# Patient Record
Sex: Female | Born: 1940 | Race: White | Hispanic: No | Smoking: Former smoker
Health system: Southern US, Community
[De-identification: ages and names within clinical notes are randomized; demographics above are authoritative.]

## PROBLEM LIST (undated history)

## (undated) ENCOUNTER — Emergency Department (HOSPITAL_COMMUNITY): Payer: Medicare Other

## (undated) DIAGNOSIS — C4490 Unspecified malignant neoplasm of skin, unspecified: Secondary | ICD-10-CM

## (undated) DIAGNOSIS — M810 Age-related osteoporosis without current pathological fracture: Secondary | ICD-10-CM

## (undated) DIAGNOSIS — C189 Malignant neoplasm of colon, unspecified: Secondary | ICD-10-CM

## (undated) DIAGNOSIS — K219 Gastro-esophageal reflux disease without esophagitis: Secondary | ICD-10-CM

## (undated) DIAGNOSIS — I10 Essential (primary) hypertension: Secondary | ICD-10-CM

## (undated) DIAGNOSIS — C50919 Malignant neoplasm of unspecified site of unspecified female breast: Secondary | ICD-10-CM

## (undated) DIAGNOSIS — F32A Depression, unspecified: Secondary | ICD-10-CM

## (undated) DIAGNOSIS — C772 Secondary and unspecified malignant neoplasm of intra-abdominal lymph nodes: Secondary | ICD-10-CM

## (undated) DIAGNOSIS — M1990 Unspecified osteoarthritis, unspecified site: Secondary | ICD-10-CM

## (undated) DIAGNOSIS — F329 Major depressive disorder, single episode, unspecified: Secondary | ICD-10-CM

## (undated) DIAGNOSIS — F419 Anxiety disorder, unspecified: Secondary | ICD-10-CM

## (undated) HISTORY — PX: BREAST SURGERY: SHX581

## (undated) HISTORY — PX: MULTIPLE TOOTH EXTRACTIONS: SHX2053

## (undated) HISTORY — PX: CHOLECYSTECTOMY: SHX55

## (undated) HISTORY — PX: MASTECTOMY: SHX3

## (undated) HISTORY — PX: SKIN CANCER EXCISION: SHX779

## (undated) HISTORY — PX: HAND SURGERY: SHX662

## (undated) HISTORY — PX: THYROID SURGERY: SHX805

## (undated) HISTORY — PX: COLON SURGERY: SHX602

---

## 1985-02-15 DIAGNOSIS — C772 Secondary and unspecified malignant neoplasm of intra-abdominal lymph nodes: Secondary | ICD-10-CM

## 1985-02-15 DIAGNOSIS — C189 Malignant neoplasm of colon, unspecified: Secondary | ICD-10-CM

## 1985-02-15 DIAGNOSIS — C4490 Unspecified malignant neoplasm of skin, unspecified: Secondary | ICD-10-CM

## 1985-02-15 HISTORY — DX: Unspecified malignant neoplasm of skin, unspecified: C44.90

## 1985-02-15 HISTORY — DX: Malignant neoplasm of colon, unspecified: C18.9

## 1985-02-15 HISTORY — DX: Malignant neoplasm of colon, unspecified: C77.2

## 1997-09-10 ENCOUNTER — Emergency Department (HOSPITAL_COMMUNITY): Admission: EM | Admit: 1997-09-10 | Discharge: 1997-09-10 | Payer: Self-pay | Admitting: Emergency Medicine

## 2000-02-02 ENCOUNTER — Other Ambulatory Visit: Admission: RE | Admit: 2000-02-02 | Discharge: 2000-02-02 | Payer: Self-pay | Admitting: Family Medicine

## 2000-02-07 ENCOUNTER — Emergency Department (HOSPITAL_COMMUNITY): Admission: EM | Admit: 2000-02-07 | Discharge: 2000-02-07 | Payer: Self-pay | Admitting: Emergency Medicine

## 2001-06-14 ENCOUNTER — Encounter: Payer: Self-pay | Admitting: Emergency Medicine

## 2001-06-14 ENCOUNTER — Emergency Department (HOSPITAL_COMMUNITY): Admission: EM | Admit: 2001-06-14 | Discharge: 2001-06-14 | Payer: Self-pay

## 2003-02-14 ENCOUNTER — Encounter: Admission: RE | Admit: 2003-02-14 | Discharge: 2003-02-14 | Payer: Self-pay | Admitting: Family Medicine

## 2003-02-14 ENCOUNTER — Encounter (INDEPENDENT_AMBULATORY_CARE_PROVIDER_SITE_OTHER): Payer: Self-pay | Admitting: Specialist

## 2003-02-16 DIAGNOSIS — C50919 Malignant neoplasm of unspecified site of unspecified female breast: Secondary | ICD-10-CM

## 2003-02-16 HISTORY — DX: Malignant neoplasm of unspecified site of unspecified female breast: C50.919

## 2003-03-05 ENCOUNTER — Encounter (HOSPITAL_COMMUNITY): Admission: RE | Admit: 2003-03-05 | Discharge: 2003-06-03 | Payer: Self-pay

## 2003-03-08 ENCOUNTER — Ambulatory Visit (HOSPITAL_COMMUNITY): Admission: RE | Admit: 2003-03-08 | Discharge: 2003-03-08 | Payer: Self-pay

## 2003-03-13 ENCOUNTER — Inpatient Hospital Stay (HOSPITAL_COMMUNITY): Admission: RE | Admit: 2003-03-13 | Discharge: 2003-03-15 | Payer: Self-pay

## 2003-03-13 ENCOUNTER — Encounter (INDEPENDENT_AMBULATORY_CARE_PROVIDER_SITE_OTHER): Payer: Self-pay | Admitting: *Deleted

## 2003-04-25 ENCOUNTER — Encounter: Admission: RE | Admit: 2003-04-25 | Discharge: 2003-04-25 | Payer: Self-pay | Admitting: Oncology

## 2003-04-26 ENCOUNTER — Ambulatory Visit (HOSPITAL_COMMUNITY): Admission: RE | Admit: 2003-04-26 | Discharge: 2003-04-26 | Payer: Self-pay | Admitting: Oncology

## 2003-04-29 ENCOUNTER — Ambulatory Visit: Admission: RE | Admit: 2003-04-29 | Discharge: 2003-06-04 | Payer: Self-pay | Admitting: *Deleted

## 2004-01-23 ENCOUNTER — Ambulatory Visit: Payer: Self-pay | Admitting: Oncology

## 2004-02-11 ENCOUNTER — Emergency Department (HOSPITAL_COMMUNITY): Admission: EM | Admit: 2004-02-11 | Discharge: 2004-02-12 | Payer: Self-pay | Admitting: Emergency Medicine

## 2004-02-14 ENCOUNTER — Encounter: Admission: RE | Admit: 2004-02-14 | Discharge: 2004-02-14 | Payer: Self-pay | Admitting: Oncology

## 2004-03-09 ENCOUNTER — Ambulatory Visit: Payer: Self-pay | Admitting: Oncology

## 2004-06-03 ENCOUNTER — Ambulatory Visit: Payer: Self-pay | Admitting: Oncology

## 2004-10-06 ENCOUNTER — Ambulatory Visit: Payer: Self-pay | Admitting: Oncology

## 2005-02-05 ENCOUNTER — Ambulatory Visit: Payer: Self-pay | Admitting: Oncology

## 2005-02-16 ENCOUNTER — Encounter: Admission: RE | Admit: 2005-02-16 | Discharge: 2005-02-16 | Payer: Self-pay | Admitting: Oncology

## 2005-06-03 ENCOUNTER — Ambulatory Visit: Payer: Self-pay | Admitting: Oncology

## 2005-06-04 LAB — CBC WITH DIFFERENTIAL/PLATELET
BASO%: 0.4 % (ref 0.0–2.0)
HCT: 39.5 % (ref 34.8–46.6)
LYMPH%: 26.3 % (ref 14.0–48.0)
MCHC: 34.4 g/dL (ref 32.0–36.0)
MCV: 93.6 fL (ref 81.0–101.0)
MONO#: 0.5 10*3/uL (ref 0.1–0.9)
MONO%: 6.6 % (ref 0.0–13.0)
NEUT%: 65.8 % (ref 39.6–76.8)
Platelets: 289 10*3/uL (ref 145–400)
RBC: 4.23 10*6/uL (ref 3.70–5.32)
WBC: 8.1 10*3/uL (ref 3.9–10.0)

## 2005-06-04 LAB — COMPREHENSIVE METABOLIC PANEL
ALT: 13 U/L (ref 0–40)
Alkaline Phosphatase: 62 U/L (ref 39–117)
CO2: 30 mEq/L (ref 19–32)
Creatinine, Ser: 0.7 mg/dL (ref 0.4–1.2)
Glucose, Bld: 84 mg/dL (ref 70–99)
Sodium: 140 mEq/L (ref 135–145)
Total Bilirubin: 0.4 mg/dL (ref 0.3–1.2)

## 2005-06-04 LAB — CANCER ANTIGEN 27.29: CA 27.29: 32 U/mL (ref 0–39)

## 2005-06-04 LAB — LACTATE DEHYDROGENASE: LDH: 159 U/L (ref 94–250)

## 2005-06-18 ENCOUNTER — Encounter: Admission: RE | Admit: 2005-06-18 | Discharge: 2005-06-18 | Payer: Self-pay | Admitting: Oncology

## 2005-11-03 ENCOUNTER — Emergency Department (HOSPITAL_COMMUNITY): Admission: EM | Admit: 2005-11-03 | Discharge: 2005-11-03 | Payer: Self-pay | Admitting: Emergency Medicine

## 2005-11-08 ENCOUNTER — Ambulatory Visit: Payer: Self-pay | Admitting: Oncology

## 2005-11-10 ENCOUNTER — Ambulatory Visit (HOSPITAL_BASED_OUTPATIENT_CLINIC_OR_DEPARTMENT_OTHER): Admission: RE | Admit: 2005-11-10 | Discharge: 2005-11-10 | Payer: Self-pay | Admitting: Orthopedic Surgery

## 2006-02-02 ENCOUNTER — Ambulatory Visit: Payer: Self-pay | Admitting: Oncology

## 2006-02-09 LAB — CBC WITH DIFFERENTIAL/PLATELET
BASO%: 0.5 % (ref 0.0–2.0)
Eosinophils Absolute: 0.1 10*3/uL (ref 0.0–0.5)
HCT: 40.9 % (ref 34.8–46.6)
LYMPH%: 28.5 % (ref 14.0–48.0)
MCHC: 34.4 g/dL (ref 32.0–36.0)
MCV: 93.3 fL (ref 81.0–101.0)
MONO#: 0.8 10*3/uL (ref 0.1–0.9)
MONO%: 6.8 % (ref 0.0–13.0)
NEUT%: 63.2 % (ref 39.6–76.8)
Platelets: 348 10*3/uL (ref 145–400)
RBC: 4.38 10*6/uL (ref 3.70–5.32)

## 2006-02-09 LAB — COMPREHENSIVE METABOLIC PANEL
Alkaline Phosphatase: 91 U/L (ref 39–117)
CO2: 29 mEq/L (ref 19–32)
Creatinine, Ser: 0.83 mg/dL (ref 0.40–1.20)
Glucose, Bld: 111 mg/dL — ABNORMAL HIGH (ref 70–99)
Sodium: 140 mEq/L (ref 135–145)
Total Bilirubin: 0.3 mg/dL (ref 0.3–1.2)
Total Protein: 7.3 g/dL (ref 6.0–8.3)

## 2006-02-09 LAB — CANCER ANTIGEN 27.29: CA 27.29: 38 U/mL (ref 0–39)

## 2006-02-09 LAB — LACTATE DEHYDROGENASE: LDH: 159 U/L (ref 94–250)

## 2006-02-24 ENCOUNTER — Encounter: Admission: RE | Admit: 2006-02-24 | Discharge: 2006-02-24 | Payer: Self-pay | Admitting: Oncology

## 2006-03-29 ENCOUNTER — Encounter: Admission: RE | Admit: 2006-03-29 | Discharge: 2006-03-29 | Payer: Self-pay | Admitting: Rheumatology

## 2006-09-05 ENCOUNTER — Ambulatory Visit: Payer: Self-pay | Admitting: Oncology

## 2006-09-28 ENCOUNTER — Encounter: Admission: RE | Admit: 2006-09-28 | Discharge: 2006-09-28 | Payer: Self-pay | Admitting: Rheumatology

## 2006-10-12 LAB — COMPREHENSIVE METABOLIC PANEL
Albumin: 3.9 g/dL (ref 3.5–5.2)
BUN: 19 mg/dL (ref 6–23)
CO2: 30 mEq/L (ref 19–32)
Calcium: 9.4 mg/dL (ref 8.4–10.5)
Chloride: 97 mEq/L (ref 96–112)
Glucose, Bld: 110 mg/dL — ABNORMAL HIGH (ref 70–99)
Potassium: 3.1 mEq/L — ABNORMAL LOW (ref 3.5–5.3)

## 2006-10-12 LAB — CBC WITH DIFFERENTIAL/PLATELET
BASO%: 0.4 % (ref 0.0–2.0)
Basophils Absolute: 0 10*3/uL (ref 0.0–0.1)
EOS%: 1.1 % (ref 0.0–7.0)
Eosinophils Absolute: 0.1 10*3/uL (ref 0.0–0.5)
HCT: 36.6 % (ref 34.8–46.6)
HGB: 13.2 g/dL (ref 11.6–15.9)
LYMPH%: 31.6 % (ref 14.0–48.0)
MCH: 33.2 pg (ref 26.0–34.0)
MCHC: 35.9 g/dL (ref 32.0–36.0)
MCV: 92.4 fL (ref 81.0–101.0)
MONO#: 0.5 10*3/uL (ref 0.1–0.9)
MONO%: 6.6 % (ref 0.0–13.0)
NEUT#: 4.8 10*3/uL (ref 1.5–6.5)
NEUT%: 60.3 % (ref 39.6–76.8)
Platelets: 269 10*3/uL (ref 145–400)
RBC: 3.96 10*6/uL (ref 3.70–5.32)
RDW: 12.5 % (ref 11.3–14.5)
WBC: 8 10*3/uL (ref 3.9–10.0)
lymph#: 2.5 10*3/uL (ref 0.9–3.3)

## 2006-10-24 ENCOUNTER — Encounter: Admission: RE | Admit: 2006-10-24 | Discharge: 2006-10-24 | Payer: Self-pay | Admitting: Rheumatology

## 2006-11-15 ENCOUNTER — Ambulatory Visit: Payer: Self-pay | Admitting: Oncology

## 2006-11-18 LAB — BASIC METABOLIC PANEL
BUN: 20 mg/dL (ref 6–23)
CO2: 27 mEq/L (ref 19–32)
Chloride: 101 mEq/L (ref 96–112)
Glucose, Bld: 98 mg/dL (ref 70–99)
Potassium: 3.5 mEq/L (ref 3.5–5.3)

## 2007-02-27 ENCOUNTER — Encounter: Admission: RE | Admit: 2007-02-27 | Discharge: 2007-02-27 | Payer: Self-pay | Admitting: Oncology

## 2007-03-07 ENCOUNTER — Encounter: Admission: RE | Admit: 2007-03-07 | Discharge: 2007-03-07 | Payer: Self-pay | Admitting: Oncology

## 2007-03-21 ENCOUNTER — Emergency Department (HOSPITAL_COMMUNITY): Admission: EM | Admit: 2007-03-21 | Discharge: 2007-03-21 | Payer: Self-pay | Admitting: Emergency Medicine

## 2007-03-22 ENCOUNTER — Ambulatory Visit (HOSPITAL_BASED_OUTPATIENT_CLINIC_OR_DEPARTMENT_OTHER): Admission: RE | Admit: 2007-03-22 | Discharge: 2007-03-22 | Payer: Self-pay | Admitting: *Deleted

## 2007-05-09 ENCOUNTER — Ambulatory Visit: Payer: Self-pay | Admitting: Oncology

## 2007-07-04 ENCOUNTER — Ambulatory Visit: Payer: Self-pay | Admitting: Oncology

## 2007-07-20 ENCOUNTER — Ambulatory Visit (HOSPITAL_COMMUNITY): Admission: RE | Admit: 2007-07-20 | Discharge: 2007-07-20 | Payer: Self-pay | Admitting: Oncology

## 2007-12-11 ENCOUNTER — Encounter: Admission: RE | Admit: 2007-12-11 | Discharge: 2007-12-11 | Payer: Self-pay | Admitting: Oncology

## 2007-12-26 ENCOUNTER — Ambulatory Visit: Payer: Self-pay | Admitting: Oncology

## 2007-12-28 LAB — CBC WITH DIFFERENTIAL/PLATELET
BASO%: 0.1 % (ref 0.0–2.0)
EOS%: 1.2 % (ref 0.0–7.0)
HCT: 41.2 % (ref 34.8–46.6)
MCHC: 34.6 g/dL (ref 32.0–36.0)
MONO#: 0.5 10*3/uL (ref 0.1–0.9)
RBC: 4.56 10*6/uL (ref 3.70–5.32)
RDW: 12.6 % (ref 11.3–14.5)
WBC: 9.2 10*3/uL (ref 3.9–10.0)
lymph#: 2.7 10*3/uL (ref 0.9–3.3)

## 2007-12-29 LAB — COMPREHENSIVE METABOLIC PANEL
ALT: 16 U/L (ref 0–35)
AST: 23 U/L (ref 0–37)
CO2: 27 mEq/L (ref 19–32)
Calcium: 9.4 mg/dL (ref 8.4–10.5)
Chloride: 99 mEq/L (ref 96–112)
Potassium: 3.3 mEq/L — ABNORMAL LOW (ref 3.5–5.3)
Sodium: 139 mEq/L (ref 135–145)
Total Protein: 7.2 g/dL (ref 6.0–8.3)

## 2007-12-29 LAB — LACTATE DEHYDROGENASE: LDH: 149 U/L (ref 94–250)

## 2008-03-01 ENCOUNTER — Encounter: Admission: RE | Admit: 2008-03-01 | Discharge: 2008-03-01 | Payer: Self-pay | Admitting: Oncology

## 2008-06-28 ENCOUNTER — Ambulatory Visit: Payer: Self-pay | Admitting: Oncology

## 2008-07-09 LAB — CBC WITH DIFFERENTIAL/PLATELET
Basophils Absolute: 0.1 10*3/uL (ref 0.0–0.1)
Eosinophils Absolute: 0.1 10*3/uL (ref 0.0–0.5)
HGB: 14.5 g/dL (ref 11.6–15.9)
LYMPH%: 24.8 % (ref 14.0–49.7)
MCV: 91.7 fL (ref 79.5–101.0)
MONO%: 6 % (ref 0.0–14.0)
NEUT#: 8.5 10*3/uL — ABNORMAL HIGH (ref 1.5–6.5)
Platelets: 269 10*3/uL (ref 145–400)
RDW: 12.6 % (ref 11.2–14.5)

## 2008-07-10 LAB — COMPREHENSIVE METABOLIC PANEL
Albumin: 4 g/dL (ref 3.5–5.2)
Alkaline Phosphatase: 68 U/L (ref 39–117)
BUN: 17 mg/dL (ref 6–23)
CO2: 29 mEq/L (ref 19–32)
Calcium: 9 mg/dL (ref 8.4–10.5)
Glucose, Bld: 103 mg/dL — ABNORMAL HIGH (ref 70–99)
Potassium: 3.3 mEq/L — ABNORMAL LOW (ref 3.5–5.3)

## 2008-07-10 LAB — VITAMIN D 25 HYDROXY (VIT D DEFICIENCY, FRACTURES): Vit D, 25-Hydroxy: 48 ng/mL (ref 30–89)

## 2008-07-10 LAB — LACTATE DEHYDROGENASE: LDH: 168 U/L (ref 94–250)

## 2008-07-11 ENCOUNTER — Ambulatory Visit (HOSPITAL_COMMUNITY): Admission: RE | Admit: 2008-07-11 | Discharge: 2008-07-11 | Payer: Self-pay | Admitting: Oncology

## 2008-07-23 ENCOUNTER — Encounter (INDEPENDENT_AMBULATORY_CARE_PROVIDER_SITE_OTHER): Payer: Self-pay | Admitting: Interventional Radiology

## 2008-07-23 ENCOUNTER — Other Ambulatory Visit: Admission: RE | Admit: 2008-07-23 | Discharge: 2008-07-23 | Payer: Self-pay | Admitting: Interventional Radiology

## 2008-07-23 ENCOUNTER — Encounter: Admission: RE | Admit: 2008-07-23 | Discharge: 2008-07-23 | Payer: Self-pay | Admitting: Oncology

## 2008-08-19 ENCOUNTER — Inpatient Hospital Stay (HOSPITAL_COMMUNITY): Admission: EM | Admit: 2008-08-19 | Discharge: 2008-08-21 | Payer: Self-pay | Admitting: Emergency Medicine

## 2009-01-03 ENCOUNTER — Ambulatory Visit: Payer: Self-pay | Admitting: Oncology

## 2009-01-07 LAB — CBC WITH DIFFERENTIAL/PLATELET
BASO%: 0.5 % (ref 0.0–2.0)
LYMPH%: 33.9 % (ref 14.0–49.7)
MCHC: 34.4 g/dL (ref 31.5–36.0)
MCV: 91.4 fL (ref 79.5–101.0)
MONO%: 4.6 % (ref 0.0–14.0)
Platelets: 240 10*3/uL (ref 145–400)
RBC: 4.48 10*6/uL (ref 3.70–5.45)
WBC: 9.9 10*3/uL (ref 3.9–10.3)

## 2009-01-08 LAB — COMPREHENSIVE METABOLIC PANEL
ALT: 10 U/L (ref 0–35)
Alkaline Phosphatase: 55 U/L (ref 39–117)
Sodium: 138 mEq/L (ref 135–145)
Total Bilirubin: 0.3 mg/dL (ref 0.3–1.2)
Total Protein: 7.2 g/dL (ref 6.0–8.3)

## 2009-01-28 ENCOUNTER — Encounter (HOSPITAL_COMMUNITY): Admission: RE | Admit: 2009-01-28 | Discharge: 2009-02-14 | Payer: Self-pay | Admitting: Oncology

## 2009-02-05 ENCOUNTER — Encounter: Admission: RE | Admit: 2009-02-05 | Discharge: 2009-02-05 | Payer: Self-pay | Admitting: Oncology

## 2009-02-05 ENCOUNTER — Other Ambulatory Visit: Admission: RE | Admit: 2009-02-05 | Discharge: 2009-02-05 | Payer: Self-pay | Admitting: Interventional Radiology

## 2009-02-15 HISTORY — PX: JOINT REPLACEMENT: SHX530

## 2009-03-14 ENCOUNTER — Encounter: Admission: RE | Admit: 2009-03-14 | Discharge: 2009-03-14 | Payer: Self-pay | Admitting: Oncology

## 2009-04-18 ENCOUNTER — Encounter (INDEPENDENT_AMBULATORY_CARE_PROVIDER_SITE_OTHER): Payer: Self-pay | Admitting: Surgery

## 2009-04-18 ENCOUNTER — Ambulatory Visit (HOSPITAL_COMMUNITY): Admission: RE | Admit: 2009-04-18 | Discharge: 2009-04-19 | Payer: Self-pay | Admitting: Surgery

## 2009-07-03 ENCOUNTER — Ambulatory Visit: Payer: Self-pay | Admitting: Oncology

## 2009-07-04 LAB — COMPREHENSIVE METABOLIC PANEL
BUN: 15 mg/dL (ref 6–23)
CO2: 32 mEq/L (ref 19–32)
Creatinine, Ser: 0.91 mg/dL (ref 0.40–1.20)
Glucose, Bld: 91 mg/dL (ref 70–99)
Total Bilirubin: 0.5 mg/dL (ref 0.3–1.2)
Total Protein: 7.8 g/dL (ref 6.0–8.3)

## 2009-07-04 LAB — LACTATE DEHYDROGENASE: LDH: 142 U/L (ref 94–250)

## 2009-07-04 LAB — CBC WITH DIFFERENTIAL/PLATELET
Basophils Absolute: 0.1 10*3/uL (ref 0.0–0.1)
Eosinophils Absolute: 0.1 10*3/uL (ref 0.0–0.5)
HCT: 42.5 % (ref 34.8–46.6)
LYMPH%: 39.7 % (ref 14.0–49.7)
MCV: 90.8 fL (ref 79.5–101.0)
MONO#: 0.7 10*3/uL (ref 0.1–0.9)
NEUT#: 4.6 10*3/uL (ref 1.5–6.5)
NEUT%: 50.2 % (ref 38.4–76.8)
Platelets: 230 10*3/uL (ref 145–400)
WBC: 9.1 10*3/uL (ref 3.9–10.3)

## 2009-07-04 LAB — CANCER ANTIGEN 27.29: CA 27.29: 38 U/mL (ref 0–39)

## 2009-09-18 ENCOUNTER — Inpatient Hospital Stay (HOSPITAL_COMMUNITY): Admission: RE | Admit: 2009-09-18 | Discharge: 2009-09-22 | Payer: Self-pay | Admitting: Orthopedic Surgery

## 2010-03-07 ENCOUNTER — Encounter: Payer: Self-pay | Admitting: Obstetrics

## 2010-03-08 ENCOUNTER — Encounter: Payer: Self-pay | Admitting: Oncology

## 2010-03-09 ENCOUNTER — Encounter: Payer: Self-pay | Admitting: Oncology

## 2010-03-13 ENCOUNTER — Other Ambulatory Visit: Payer: Self-pay | Admitting: Oncology

## 2010-03-13 DIAGNOSIS — Z9012 Acquired absence of left breast and nipple: Secondary | ICD-10-CM

## 2010-03-13 DIAGNOSIS — Z1239 Encounter for other screening for malignant neoplasm of breast: Secondary | ICD-10-CM

## 2010-03-21 ENCOUNTER — Encounter: Payer: Self-pay | Admitting: Oncology

## 2010-04-15 ENCOUNTER — Ambulatory Visit
Admission: RE | Admit: 2010-04-15 | Discharge: 2010-04-15 | Disposition: A | Discharge status: Home / Self Care | Payer: Medicare Other | Source: Ambulatory Visit | Attending: Oncology | Admitting: Oncology

## 2010-04-15 DIAGNOSIS — Z9012 Acquired absence of left breast and nipple: Secondary | ICD-10-CM

## 2010-04-15 DIAGNOSIS — Z1239 Encounter for other screening for malignant neoplasm of breast: Secondary | ICD-10-CM

## 2010-05-01 LAB — HEMOGLOBIN AND HEMATOCRIT, BLOOD
HCT: 26.7 % — ABNORMAL LOW (ref 36.0–46.0)
HCT: 28.8 % — ABNORMAL LOW (ref 36.0–46.0)
HCT: 31.7 % — ABNORMAL LOW (ref 36.0–46.0)
HCT: 37.8 % (ref 36.0–46.0)
Hemoglobin: 9.1 g/dL — ABNORMAL LOW (ref 12.0–15.0)
Hemoglobin: 9.2 g/dL — ABNORMAL LOW (ref 12.0–15.0)

## 2010-05-01 LAB — POCT I-STAT EG7
Bicarbonate: 33.8 mEq/L — ABNORMAL HIGH (ref 20.0–24.0)
HCT: 40 % (ref 36.0–46.0)
Hemoglobin: 13.6 g/dL (ref 12.0–15.0)
Potassium: 3.3 mEq/L — ABNORMAL LOW (ref 3.5–5.1)
Sodium: 140 mEq/L (ref 135–145)
TCO2: 36 mmol/L (ref 0–100)

## 2010-05-01 LAB — BASIC METABOLIC PANEL
BUN: 10 mg/dL (ref 6–23)
BUN: 6 mg/dL (ref 6–23)
CO2: 27 mEq/L (ref 19–32)
CO2: 29 mEq/L (ref 19–32)
Calcium: 8 mg/dL — ABNORMAL LOW (ref 8.4–10.5)
Chloride: 98 mEq/L (ref 96–112)
Chloride: 99 mEq/L (ref 96–112)
Creatinine, Ser: 0.81 mg/dL (ref 0.4–1.2)
GFR calc non Af Amer: >60 mL/min (ref >60)
GFR calc non Af Amer: >60 mL/min (ref >60)
GFR calc non Af Amer: >60 mL/min (ref >60)
Glucose, Bld: 139 mg/dL — ABNORMAL HIGH (ref 70–99)
Glucose, Bld: 143 mg/dL — ABNORMAL HIGH (ref 70–99)
Potassium: 3 mEq/L — ABNORMAL LOW (ref 3.5–5.1)
Potassium: 3.3 mEq/L — ABNORMAL LOW (ref 3.5–5.1)
Sodium: 135 mEq/L (ref 135–145)
Sodium: 137 mEq/L (ref 135–145)
Sodium: 138 mEq/L (ref 135–145)

## 2010-05-01 LAB — TYPE AND SCREEN
ABO/RH(D): O POS
Antibody Screen: NEGATIVE

## 2010-05-01 LAB — PROTIME-INR
INR: 1.3 (ref 0.00–1.49)
INR: 1.54 — ABNORMAL HIGH (ref 0.00–1.49)
Prothrombin Time: 16.4 seconds — ABNORMAL HIGH (ref 11.6–15.2)

## 2010-05-02 LAB — CBC
HCT: 42.6 % (ref 36.0–46.0)
Hemoglobin: 14.9 g/dL (ref 12.0–15.0)
MCH: 31.8 pg (ref 26.0–34.0)
MCHC: 35 g/dL (ref 30.0–36.0)
MCV: 90.8 fL (ref 78.0–100.0)
RBC: 4.7 MIL/uL (ref 3.87–5.11)

## 2010-05-02 LAB — DIFFERENTIAL
Basophils Absolute: 0 10*3/uL (ref 0.0–0.1)
Eosinophils Absolute: 0.1 10*3/uL (ref 0.0–0.7)
Eosinophils Relative: 1 % (ref 0–5)
Lymphocytes Relative: 35 % (ref 12–46)
Lymphs Abs: 3.4 10*3/uL (ref 0.7–4.0)
Neutrophils Relative %: 56 % (ref 43–77)

## 2010-05-02 LAB — COMPREHENSIVE METABOLIC PANEL
ALT: 18 U/L (ref 0–35)
AST: 33 U/L (ref 0–37)
CO2: 29 mEq/L (ref 19–32)
Calcium: 9.5 mg/dL (ref 8.4–10.5)
Chloride: 99 mEq/L (ref 96–112)
Creatinine, Ser: 0.9 mg/dL (ref 0.4–1.2)
GFR calc Af Amer: >60 mL/min (ref >60)
GFR calc non Af Amer: >60 mL/min (ref >60)
Glucose, Bld: 107 mg/dL — ABNORMAL HIGH (ref 70–99)
Total Bilirubin: 0.5 mg/dL (ref 0.3–1.2)

## 2010-05-02 LAB — URINALYSIS, ROUTINE W REFLEX MICROSCOPIC
Glucose, UA: NEGATIVE mg/dL
Ketones, ur: NEGATIVE mg/dL
Nitrite: NEGATIVE
Protein, ur: NEGATIVE mg/dL
Urobilinogen, UA: 0.2 mg/dL (ref 0.0–1.0)

## 2010-05-02 LAB — PROTIME-INR
INR: 1.01 (ref 0.00–1.49)
Prothrombin Time: 13.2 seconds (ref 11.6–15.2)

## 2010-05-02 LAB — SURGICAL PCR SCREEN
MRSA, PCR: POSITIVE — AB
Staphylococcus aureus: POSITIVE — AB

## 2010-05-02 LAB — URINE MICROSCOPIC-ADD ON

## 2010-05-10 LAB — CBC
HCT: 44.9 % (ref 36.0–46.0)
Hemoglobin: 14.9 g/dL (ref 12.0–15.0)
Platelets: 259 10*3/uL (ref 150–400)
RBC: 4.87 MIL/uL (ref 3.87–5.11)
WBC: 11.5 10*3/uL — ABNORMAL HIGH (ref 4.0–10.5)

## 2010-05-10 LAB — URINALYSIS, ROUTINE W REFLEX MICROSCOPIC
Glucose, UA: NEGATIVE mg/dL
Ketones, ur: NEGATIVE mg/dL
Protein, ur: NEGATIVE mg/dL
Urobilinogen, UA: 0.2 mg/dL (ref 0.0–1.0)

## 2010-05-10 LAB — BASIC METABOLIC PANEL
Chloride: 98 mEq/L (ref 96–112)
GFR calc Af Amer: >60 mL/min (ref >60)
GFR calc non Af Amer: 55 mL/min — ABNORMAL LOW (ref >60)
Potassium: 3.4 mEq/L — ABNORMAL LOW (ref 3.5–5.1)
Sodium: 138 mEq/L (ref 135–145)

## 2010-05-10 LAB — CALCIUM
Calcium: 8.6 mg/dL (ref 8.4–10.5)
Calcium: 9 mg/dL (ref 8.4–10.5)

## 2010-05-10 LAB — DIFFERENTIAL
Eosinophils Relative: 1 % (ref 0–5)
Lymphocytes Relative: 35 % (ref 12–46)
Lymphs Abs: 4.1 10*3/uL — ABNORMAL HIGH (ref 0.7–4.0)
Monocytes Absolute: 0.7 10*3/uL (ref 0.1–1.0)
Monocytes Relative: 6 % (ref 3–12)

## 2010-05-10 LAB — URINE MICROSCOPIC-ADD ON

## 2010-05-10 LAB — PROTIME-INR: Prothrombin Time: 12.6 seconds (ref 11.6–15.2)

## 2010-05-21 IMAGING — US US BIOPSY
1 series · 7 of 7 positions shown · non-contrast
Comparison: none

CLINICAL DATA: Patient with history of breast cancer and thyroid
ultrasound at [HOSPITAL] on 01/28/2009 which revealed a
dominant solid/ cystic nodule in the inferior pole of the right
thyroid lobe measuring 3.5 x 2.7 x 2.5 cm.  Request is now made for
needle aspirate biopsy of this dominant right inferior pole thyroid
nodule

ULTRASOUND-GUIDED NEEDLE ASPIRATE BIOPSY, DOMINANT RIGHT INFERIOR
POLE THYROID NODULE
The above procedure was discussed with the patient and written
informed consent was obtained.

[Series 1: us biopsy · 7 acquisitions, 7 frames shown]
[im 1/7]
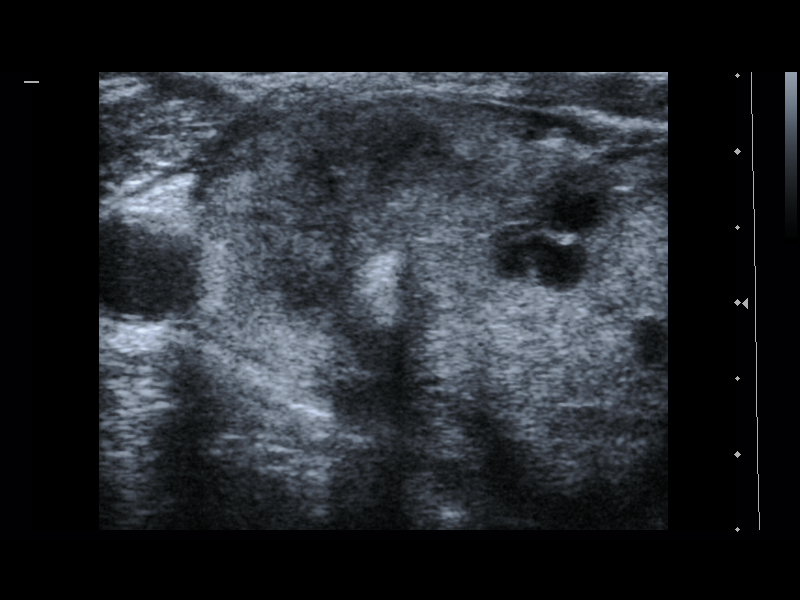
[im 2/7]
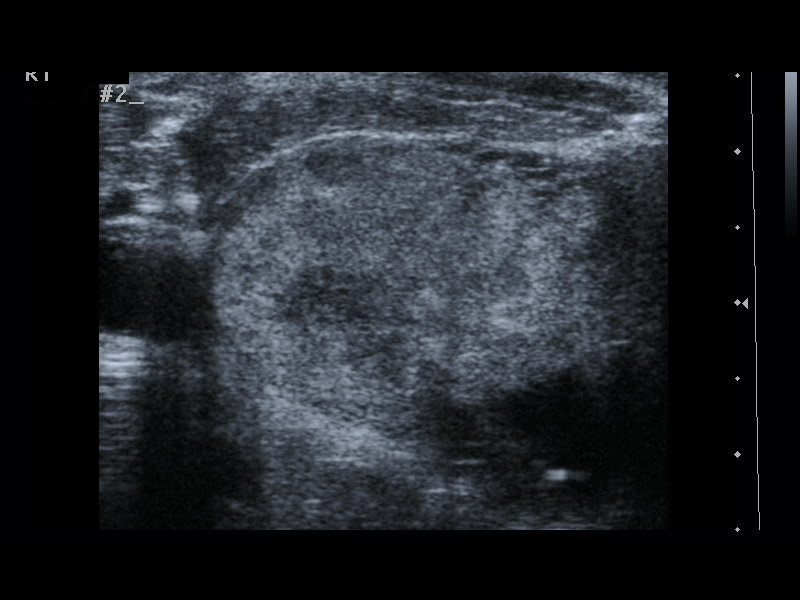
[im 3/7]
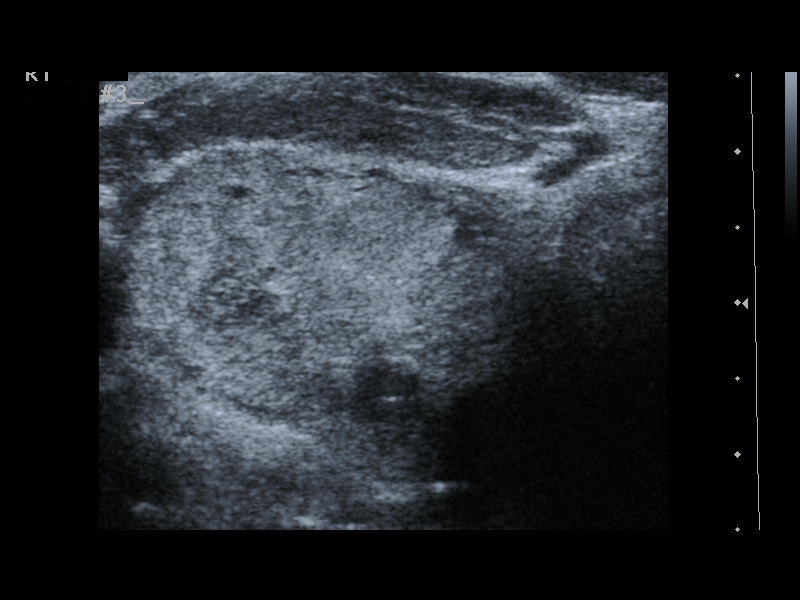
[im 4/7]
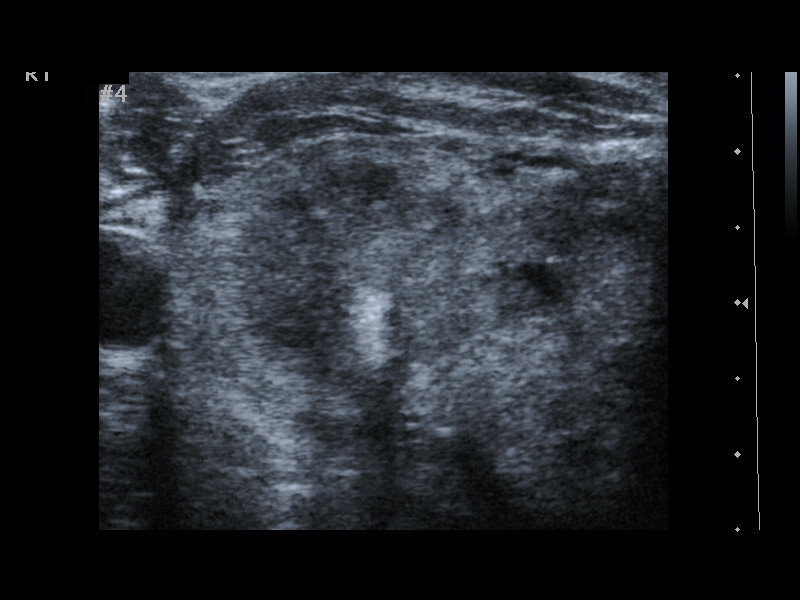
[im 5/7]
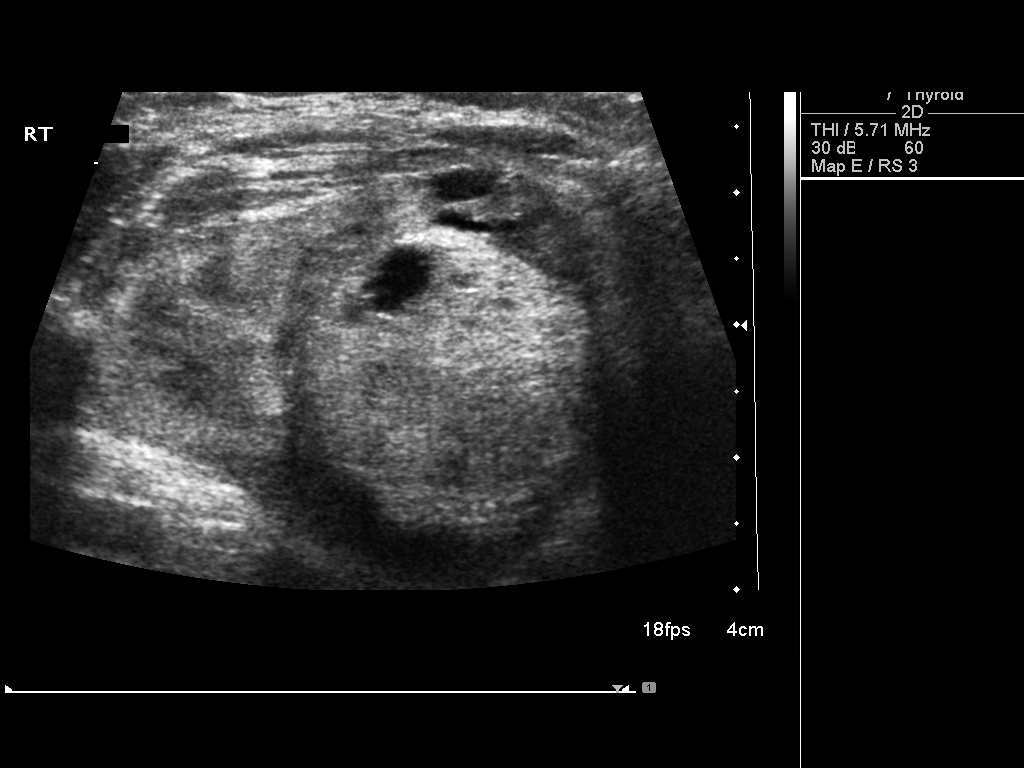
[im 6/7]
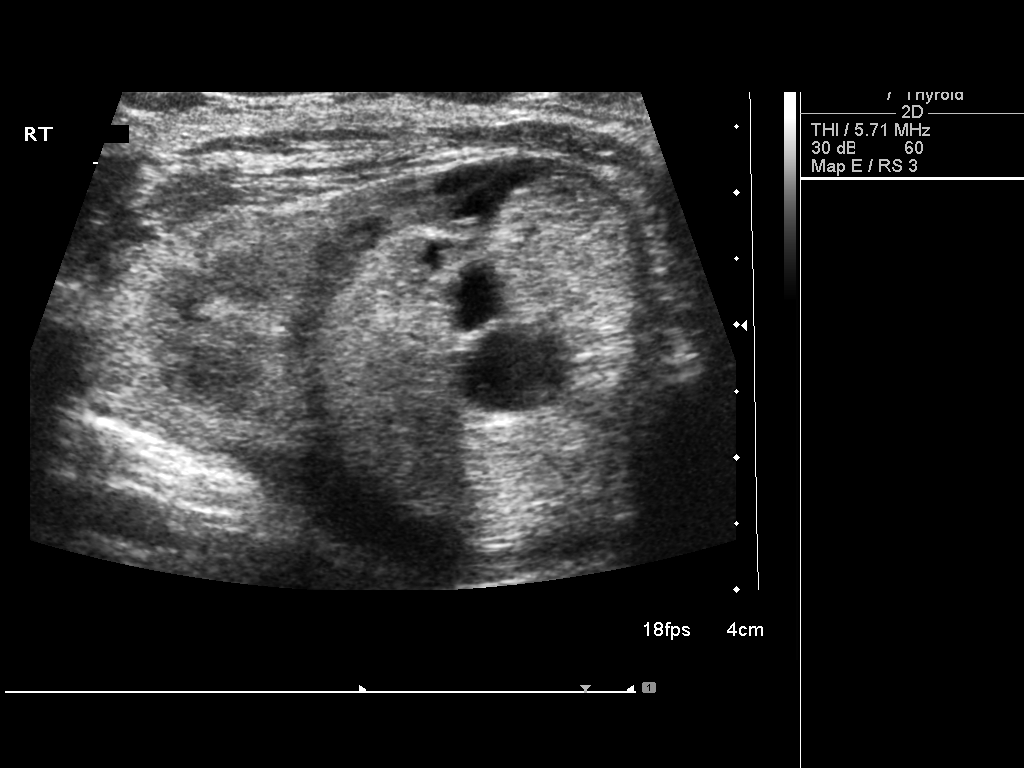
[im 7/7]
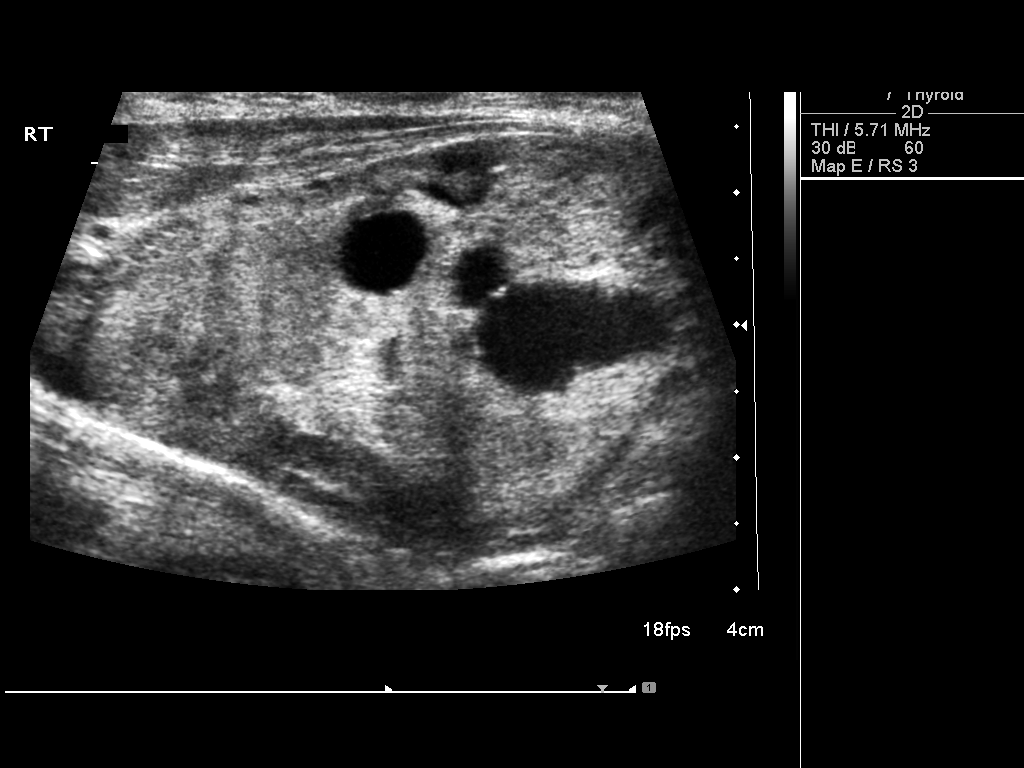

[7 of 7 positions shown; findings below may reference images not displayed]

FINDINGS: Ultrasound was performed to localize and mark an adequate
site for the biopsy.  The patient was then prepped and draped in a
normal sterile fashion.  Local anesthesia was provided with 1%
lidocaine.  Under direct ultrasound guidance, 4 passes were made
using 25 gauge needles into the dominant nodule within the right
inferior pole of the thyroid.  Ultrasound was used to confirm
needle placements on all occasions.  Specimens were sent to
Pathology for analysis.  Post procedural imaging demonstrated no
hematoma or immediate complication.  The patient tolerated the
procedure well.
IMPRESSION: Successful ultrasound guided needle aspirate biopsy of a dominant
solid/cystic nodule in the inferior pole of the right thyroid lobe.
Pathology pending.

Read by: Vd Heever, Phologo.-STEEN

## 2010-05-24 LAB — BASIC METABOLIC PANEL
BUN: 9 mg/dL (ref 6–23)
CO2: 27 mEq/L (ref 19–32)
Chloride: 101 mEq/L (ref 96–112)
Creatinine, Ser: 0.87 mg/dL (ref 0.4–1.2)

## 2010-05-24 LAB — CK TOTAL AND CKMB (NOT AT ARMC)
CK, MB: 0.8 ng/mL (ref 0.3–4.0)
Relative Index: 0.3 (ref 0.0–2.5)

## 2010-05-24 LAB — URINE MICROSCOPIC-ADD ON

## 2010-05-24 LAB — CBC
Hemoglobin: 11.5 g/dL — ABNORMAL LOW (ref 12.0–15.0)
Hemoglobin: 13.7 g/dL (ref 12.0–15.0)
MCHC: 34.3 g/dL (ref 30.0–36.0)
MCHC: 34.8 g/dL (ref 30.0–36.0)
MCV: 92.7 fL (ref 78.0–100.0)
Platelets: 225 10*3/uL (ref 150–400)
RBC: 3.36 MIL/uL — ABNORMAL LOW (ref 3.87–5.11)
RBC: 3.58 MIL/uL — ABNORMAL LOW (ref 3.87–5.11)
RDW: 12.9 % (ref 11.5–15.5)
WBC: 12.1 10*3/uL — ABNORMAL HIGH (ref 4.0–10.5)

## 2010-05-24 LAB — TROPONIN I: Troponin I: 0.01 ng/mL (ref 0.00–0.06)

## 2010-05-24 LAB — DIFFERENTIAL
Basophils Absolute: 0 10*3/uL (ref 0.0–0.1)
Basophils Relative: 0 % (ref 0–1)
Eosinophils Absolute: 0 10*3/uL (ref 0.0–0.7)
Lymphocytes Relative: 8 % — ABNORMAL LOW (ref 12–46)
Monocytes Absolute: 0.7 10*3/uL (ref 0.1–1.0)
Monocytes Relative: 6 % (ref 3–12)
Neutro Abs: 10.3 10*3/uL — ABNORMAL HIGH (ref 1.7–7.7)
Neutrophils Relative %: 62 % (ref 43–77)

## 2010-05-24 LAB — COMPREHENSIVE METABOLIC PANEL
ALT: 28 U/L (ref 0–35)
Albumin: 3.2 g/dL — ABNORMAL LOW (ref 3.5–5.2)
Alkaline Phosphatase: 45 U/L (ref 39–117)
BUN: 22 mg/dL (ref 6–23)
CO2: 30 mEq/L (ref 19–32)
Calcium: 7.8 mg/dL — ABNORMAL LOW (ref 8.4–10.5)
Creatinine, Ser: 1.64 mg/dL — ABNORMAL HIGH (ref 0.4–1.2)
GFR calc non Af Amer: 45 mL/min — ABNORMAL LOW (ref >60)
Glucose, Bld: 98 mg/dL (ref 70–99)
Potassium: 2.8 mEq/L — ABNORMAL LOW (ref 3.5–5.1)
Potassium: 3.7 mEq/L (ref 3.5–5.1)
Sodium: 138 mEq/L (ref 135–145)
Total Protein: 7.5 g/dL (ref 6.0–8.3)

## 2010-05-24 LAB — URINALYSIS, ROUTINE W REFLEX MICROSCOPIC
Bilirubin Urine: NEGATIVE
Nitrite: NEGATIVE
Specific Gravity, Urine: 1.013 (ref 1.005–1.030)
Urobilinogen, UA: 0.2 mg/dL (ref 0.0–1.0)

## 2010-05-24 LAB — CULTURE, BLOOD (ROUTINE X 2)
Culture: NO GROWTH
Culture: NO GROWTH

## 2010-05-24 LAB — MAGNESIUM: Magnesium: 1.7 mg/dL (ref 1.5–2.5)

## 2010-05-24 LAB — URINE CULTURE: Culture: NO GROWTH

## 2010-05-24 LAB — PHOSPHORUS: Phosphorus: 2.4 mg/dL (ref 2.3–4.6)

## 2010-05-24 LAB — POTASSIUM: Potassium: 3.4 mEq/L — ABNORMAL LOW (ref 3.5–5.1)

## 2010-05-24 LAB — CLOSTRIDIUM DIFFICILE EIA

## 2010-05-24 LAB — LACTIC ACID, PLASMA: Lactic Acid, Venous: 1.8 mmol/L (ref 0.5–2.2)

## 2010-06-30 NOTE — H&P (Signed)
NAMEBRADIE, [name]              ACCOUNT NO.:  [id number]   MEDICAL RECORD NO.:  [id number]          PATIENT TYPE:  EMS   LOCATION:  ED                           FACILITY[facility]  [facility]   PHYSICIAN:  Michiel Cowboy, MDDATE OF BIRTH:  1940-08-14   DATE OF ADMISSION:  08/19/2008  DATE OF DISCHARGE:                              HISTORY & PHYSICAL   PRIMARY CARE PHYSICIAN:  [facility].   CHIEF COMPLAINT:  Nausea, vomiting, back pain, and fever.   HISTORY OF PRESENT ILLNESS:  The patient is a 70 year old female with  history of breast cancer, colon cancer and hypertension. About a week or  so ago she started to develop fevers, nausea, vomiting when she  presented to [facility] who diagnosed her with UTI, started her on Cipro  and sent her home.  But she continued to have nausea and vomiting and  not feeling well.  Eventually she could not keep the Cipro down and  called [facility] again who asked her to just come to the emergency  department. She still has urine with few bacteria and some white blood  cells as well as some red blood cells, at which point [facility] physician  was called for admission.   REVIEW OF SYSTEMS:  The patient has not had any diarrhea for past week  but has had some nausea and vomiting, some back pain mainly on the left.  No chest pain, no shortness of breath. Had some fevers even this morning  up to 101. Otherwise unremarkable review of systems.   PAST MEDICAL HISTORY:  1. Arthritis.  2. Breast cancer.  3. Hypertension.  4. History of melanoma.  5. History of colon cancer in 1986.  6. For her breast cancer, she is followed by Dr. Donnie Coffin and continues      to take tamoxifen.  She is status post mastectomy.   SOCIAL HISTORY:  The patient used to smoke but quit 5 years ago.  Does  not drink, does not abuse drugs.   FAMILY HISTORY:  Noncontributory.   ALLERGIES:  Aspirin (irritates her stomach.)   MEDICATIONS:  1. Tamoxifen 2.5 mg daily.  2.  Klonopin 0.5 mg twice a day.  3. Fosamax once a week.  4. Ambien 10 mg at bedtime.  5. Triamterene, she does not more dose but takes it once a day.   PHYSICAL EXAMINATION:  VITAL SIGNS:  Temperature 98.2, but up to 100.7  with repeat check. Blood pressure of 118/79, pulse 95, respirations 18,  sating 94% on room air.  GENERAL:  The patient appears to be in no acute distress.  HEENT:  Head nontraumatic.  Somewhat dry mucous membranes.  LUNGS:  Clear to auscultation bilaterally.  CARDIOVASCULAR:  Regular rate and rhythm.  No murmurs appreciated.  ABDOMEN:  Soft, nontender, nondistended.  EXTREMITIES:  Lower extremities, no clubbing, cyanosis or edema.  SKIN:  Clean, dry and intact.  There is a slight costovertebral  tenderness on the left.  NEUROLOGIC:  The patient overall intact.   LABORATORY DATA:  White blood cell count 12.1, hemoglobin 13.7, sodium  137, potassium 2.8, creatinine 0.64, AST slightly elevated at 44.  UA  showing 11-21 white blood cells as well as well 20 red blood cells and  few bacteria.   EKG showing normal sinus rhythm, heart rate 84, no evidence of acute  ischemia infarction.   ASSESSMENT/PLAN:  This is a 70 year old female who presents with urinary  tract infection, question of pyelonephritis.  1. Possible pyelonephritis.  This is a complicated UTI. UA appears to      be a mixed picture of red blood cells as well as white blood cells.      Will need to await results of urine culture which are pending. For      right now will cover with Rocephin. Obtain renal ultrasound to      evaluate for any obstruction/abscess or renal inflammatory changes.      Give IV fluids. Advance diet slowly. Treat for nausea.  2. Low potassium, replaced.  3. Dehydration clinically. Give IV fluids.  Follow orthostatic's.  4. History of hypertension. For now hold Triamterine.  5. History of breast cancer.  Continue home medications.  6. Prophylaxis plus Lovenox.      Michiel Cowboy, MD  Electronically Signed     AVD/MEDQ  D:  08/19/2008  T:  08/19/2008  Job:  [id number]

## 2010-06-30 NOTE — Discharge Summary (Signed)
[name], [name]              ACCOUNT NO.:  [id number]   MEDICAL RECORD NO.:  [id number]          PATIENT TYPE:  INP   LOCATION:  1427                         FACILITY:  [facility]   PHYSICIAN:  Monte Fantasia, MD  DATE OF BIRTH:  20-Jul-1940   DATE OF ADMISSION:  08/19/2008  DATE OF DISCHARGE:                               DISCHARGE SUMMARY   PRIMARY CARE PHYSICIAN:  [facility] at [employer].   DISCHARGE DIAGNOSES:  1. Pyelonephritis.  2. Hypokalemia which is supplemented.  3. Dehydration.  4. Acute renal insufficiency which is resolved.  5. History of breast cancer, colon cancer and melanoma.   MEDICATIONS UPON DISCHARGE:  1. Avelox 400 mg p.o. daily for 11 days.  2. Aspirin 81 mg p.o. daily.  3. Tamoxifen 2.5 mg once daily.  4. Klonopin 0.5 mg p.o. b.i.d.  5. Fosamax 70 mg once a week.   COURSE DURING THE HOSPITAL STAY:  Ms. Jawad is a 70 year old pleasant  Caucasian lady patient, was admitted on August 19, 2008 with complaints of  nausea, vomiting, back pain and fever.  The patient was a sent in from  [facility] physicians at [facility] and diagnosed with the UTI.  Was  initially started on ciprofloxacin, however, the patient was unable to  keep anything p.o. and had multiple episodes of vomiting and hence was  admitted for the same.  The patient was diagnosed with pyelonephritis  and blood and urine cultures have been no growth to date.  The patient's  oral intake has improved well and is tolerating p.o. well.  Has had no  episodes of vomiting through the stay in the hospital.  Initially the  patient was started on ceftriaxone, would plan to discontinue the  patient on Avelox if patient tolerates it well.  Would need a total  course of 11 more days to complete a course of 14 days.   RADIOLOGICAL INVESTIGATIONS DONE DURING THE STAY IN THE HOSPITAL:  1. Chest x-ray done on August 19, 2008.  Impression:  No active disease.  2. Renal ultrasound done on August 20, 2008.  Impression:   Small simple      cyst at lower pole of the right kidney, otherwise unremarkable.   LABS DONE DURING THE STAY IN THE HOSPITAL:  Total WBC improved 7.2,  improved from 12.1, hemoglobin 10.8, hematocrit 31.2, platelet count of  225, sodium 136, potassium 3.4, chloride 101, bicarb 27, glucose 96, BUN  9, creatinine 0.87, alkaline phosphatase 45, total bilirubin 0.4, AST  42, ALT 28, total protein 5.7, albumin 2.5, calcium 7.2.  UA has been  leukocytes trace, WBCs 11 - 20, bacteria few.  Blood cultures done on  August 19, 2008 have been no growth to date and urine cultures have been no  growth.   DISPOSITION:  The patient is medically stable to be discharged.  Has  resolved with her urinary tract infection, nausea, vomiting and the  renal insufficiency.  Cultures have been no growth to date.  The patient  is recommended to follow up with her primary care physician in next 1  week.  Total time  for dictation of 40 minutes with 20 minutes of patient  counseling.      Monte Fantasia, MD  Electronically Signed     MP/MEDQ  D:  08/21/2008  T:  08/21/2008  Job:  [id number]   cc:   [facility]

## 2010-06-30 NOTE — Op Note (Signed)
NAMESUN, KIHN              ACCOUNT NO.:  [id number]   MEDICAL RECORD NO.:  [id number]          PATIENT TYPE:  AMB   LOCATION:  DSC                          FACILITY:  [facility]   PHYSICIAN:  Artist Pais. Weingold, M.D.DATE OF BIRTH:  08-Jun-1940   DATE OF PROCEDURE:  03/22/2007  DATE OF DISCHARGE:  03/22/2007                               OPERATIVE REPORT   PREOPERATIVE DIAGNOSIS:  Displaced left radius and ulnar fractures and  left carpal tunnel syndrome.   POSTOPERATIVE DIAGNOSIS:  Displaced left radius and ulnar fractures and  left carpal tunnel syndrome.   PROCEDURE:  Open reduction and internal fixation left distal radius  fracture, open treatment left ulnar fracture and left carpal tunnel  release.   SURGEON:  Artist Pais. Mina Marble, M.D.   ASSISTANT:  None.   ANESTHESIA:  Was general.   TOURNIQUET TIME:  One hour and 3 minutes.   COMPLICATIONS:  None.   DRAINS:  None.   PROCEDURE IN DETAIL:  The patient was taken to the operating suite after  the induction of adequate general anesthesia.  The left upper extremity  was prepped and draped in usual sterile fashion.  An Esmarch was used to  exsanguinated the limb.  Tourniquet was then inflated to 250 mmHg.  At  this point in time a standard volar approach to the distal radius was  undertaken.  Skin was incised over the palpable border of the flexor  carpi radialis tendon.  Skin was incised 6-8 cm.  The sheath overlying  the FCR was incised.  This was retracted in the midline.  Radial artery  to the lateral side interval was developed down to the level of the  pronator quadratus.  This was subperiosteally stripped off the distal  radius exposing the displaced intra-articular fracture.  After release  of brachioradialis in the first dorsal compartment reduction was  performed.  DVR plate was then fixed to the volar aspect of the distal  radius through the slotted hole.  Intraoperative fluoroscopy revealed  adequate  reduction in both the lateral and oblique view.  The remaining  cortical screws were placed proximally followed by smooth pegs distally.  At the end of the procedure closed reduction was performed of the ulna  which was deemed stable.  The wound was then thoroughly irrigated.  The  median nerve was carefully released through the distal aspect of the  incision using a Free elevator to protect the median nerve.  The  transverse carpal ligament was divided with a curved  blunt scissors.  The wounds were irrigated, loosely closed in layers of  zero Vicryl for the pronator quadratus and a 3-0 Prolene subcuticular  stitch on the skin.  Steri-Strips, 4x4s, fluffs and volar splint was  applied.  The patient tolerated the procedure well.      Artist Pais Mina Marble, M.D.  Electronically Signed     MAW/MEDQ  D:  03/24/2007  T:  03/25/2007  Job:  [id number]

## 2010-07-03 NOTE — Op Note (Signed)
NAMESIRIYAH, AMBROSIUS              ACCOUNT NO.:  [id number]   MEDICAL RECORD NO.:  [id number]          PATIENT TYPE:  AMB   LOCATION:  DSC                          FACILITY:  [facility]   PHYSICIAN:  Artist Pais. Weingold, M.D.DATE OF BIRTH:  Dec 18, 1940   DATE OF PROCEDURE:  11/10/2005  DATE OF DISCHARGE:                                 OPERATIVE REPORT   PREOPERATIVE DIAGNOSIS:  Displaced right wrist distal radius intra-articular  fracture with carpal tunnel syndrome.   POSTOPERATIVE DIAGNOSIS:  Displaced right wrist distal radius intra-  articular fracture with carpal tunnel syndrome.   PROCEDURE:  ORIF of above with DVR plate and screws, and opened carpal  tunnel release through separate incision.   SURGEON:  Artist Pais. Mina Marble, M.D.   ASSISTANT:  None.   ANESTHESIA:  General.   TOURNIQUET TIME:  15 minutes.   COMPLICATIONS:  None.   DRAINS:  None.   OPERATIVE REPORT:  The patient was taken to the operating room.  After  induction of adequate general anesthesia, the right upper extremity was  prepped and draped in sterile fashion.  An Esmarch was used to exsanguinate  the limb.  Tourniquet was then inflated to 250 mmHg.  At this point in time  an incision was made over the flexor carpi radialis tendon on the right  wrist.  Starting at the wrist flexion crease and going proximally for 6-8  cm, the skin was incised.  The sheath overlying the FCR was incised.  The  FCR was retracted in the midline.  The radial artery, the lateral side,  interval was developed down to the level of pronator quadratus.  This was  subperiosteally stripped off the distal radius revealing a displaced intra-  articular fracture of the distal radius.  Once this was done, a reduction  was performed.  A DVR plated was then fashioned to the lower aspect of the  distal radius with one cortical screw in the slotted hole.  Intraoperative  fluoroscopy then revealed adequate reduction by the AP and lateral  and  oblique view.  The remaining cortical screws were placed followed by the  seven distal pegs under direct and fluoroscopic guidance.  Intraoperative  fluoroscopy then revealed a near-anatomic reduction by the AP and lateral  and oblique view.  At this point in time, the pronator quadratus was closed  with a 3-0 Vicryl undyed stitch, followed by irrigation, hemostasis with  bipolar cautery, and skin closure with 3-0 Prolene subcuticular stitch.   A second incision was made at the palmar aspect of the right hand in line  with the long femoral carpals.  Starting at Kaplan's cardinal line, the skin  was incised.  Palmar fascia was identified and split.  The distal edge of  the transverse carpal ligament was identified and split with 15 blade.  The  median nerve was identified and protected with Therapist, nutritional.  The  remaining aspects of the transverse carpal ligament divided under direct  vision using curved blunt scissors.  The canal was inspected.  There were no  osteophytic ganglions present.  There was blood in the  canal.  This was irrigated out.  This incision was also closed with 3-0 Prolene  subcuticular stitch.  Steri-Strips, 4 x 4's fluffs, and compressive dressing  was applied.  Patient tolerated the procedure well, went to Recovery  in  stable fashion.      Artist Pais Mina Marble, M.D.  Electronically Signed     MAW/MEDQ  D:  11/10/2005  T:  11/10/2005  Job:  [id number]

## 2010-07-03 NOTE — Op Note (Signed)
NAME:  [name], [name]                        ACCOUNT NO.:  [id number]   MEDICAL RECORD NO.:  [id number]                   PATIENT TYPE:  OUT   LOCATION:  MRI                                  FACILITY:  [facility]   PHYSICIAN:  Lorre Munroe., M.D.            DATE OF BIRTH:  August 01, 1940   DATE OF PROCEDURE:  03/13/2003  DATE OF DISCHARGE:  03/08/2003                                 OPERATIVE REPORT   PREOPERATIVE DIAGNOSIS:  Carcinoma of the left breast.   POSTOPERATIVE DIAGNOSIS:  Carcinoma of the left breast.   OPERATION PERFORMED:  Left total mastectomy with left axillary sentinel  lymph node biopsy.   SURGEON:  Lebron Conners, M.D.   ASSISTANT:  Rose Phi. Maple Hudson, M.D.   ANESTHESIA:  General.   DESCRIPTION OF PROCEDURE:  After the patient was monitored and anesthetized,  I injected 7 mL of blue dye beneath the left nipple and areola.  After that  was massaged for several minutes, the patient had routine preparation and  draping of the left breast.  I made an elliptical incision around the nipple  and areola taking a generous amount of skin because the patient has very  large breasts.  I then developed skin and subcutaneous flaps first  superiorly until the breast thinned out near the clavicle and going on out  to the edge of the pectoral muscle and up over the axillary skin.  I then  located the very strongly radioactive lymph node in the low axilla, incised  it and sent it for Touch Preps.  There were actually two lymph nodes present  and as we continued the operation, the analysis took place.  Both were found  to be negative for tumor.  I developed flaps medially to sternum, inferiorly  to the rectus fascia and laterally to the latissimus dorsi muscle and  deepened the dissection to fascia at each location. I reflected the breast  off of the chest wall from cephalad to caudad and medial to lateral using  the Bovie and gaining hemostasis with the cautery.  Hemostasis was very  good  throughout the procedure.  I then removed the breast along with a couple of  very low axillary nodes.  After getting good hemostasis and irrigating the  area, I placed two flat suction drains, one under the anterior flaps, one  out toward the axilla through separate stab incisions and sutured them on  with 2-0 silk.  I closed the skin with staples and secured drains with 2-0  silk stitches.  The patient did well during the operation.  The sponge,  needle and instrument counts were correct.                                               Lorre Munroe., M.D.  WB/MEDQ  D:  03/13/2003  T:  03/13/2003  Job:  [id number]

## 2010-08-24 ENCOUNTER — Emergency Department (INDEPENDENT_AMBULATORY_CARE_PROVIDER_SITE_OTHER): Payer: Medicare Other

## 2010-08-24 ENCOUNTER — Emergency Department (HOSPITAL_BASED_OUTPATIENT_CLINIC_OR_DEPARTMENT_OTHER)
Admission: EM | Admit: 2010-08-24 | Discharge: 2010-08-24 | Disposition: A | Discharge status: Home / Self Care | Payer: Medicare Other | Attending: Emergency Medicine | Admitting: Emergency Medicine

## 2010-08-24 ENCOUNTER — Encounter: Payer: Self-pay | Admitting: *Deleted

## 2010-08-24 DIAGNOSIS — M5020 Other cervical disc displacement, unspecified cervical region: Secondary | ICD-10-CM

## 2010-08-24 DIAGNOSIS — J3489 Other specified disorders of nose and nasal sinuses: Secondary | ICD-10-CM

## 2010-08-24 DIAGNOSIS — I10 Essential (primary) hypertension: Secondary | ICD-10-CM | POA: Insufficient documentation

## 2010-08-24 DIAGNOSIS — F3289 Other specified depressive episodes: Secondary | ICD-10-CM | POA: Insufficient documentation

## 2010-08-24 DIAGNOSIS — F329 Major depressive disorder, single episode, unspecified: Secondary | ICD-10-CM | POA: Insufficient documentation

## 2010-08-24 DIAGNOSIS — M47812 Spondylosis without myelopathy or radiculopathy, cervical region: Secondary | ICD-10-CM

## 2010-08-24 DIAGNOSIS — K219 Gastro-esophageal reflux disease without esophagitis: Secondary | ICD-10-CM | POA: Insufficient documentation

## 2010-08-24 DIAGNOSIS — R51 Headache: Secondary | ICD-10-CM | POA: Insufficient documentation

## 2010-08-24 HISTORY — DX: Major depressive disorder, single episode, unspecified: F32.9

## 2010-08-24 HISTORY — DX: Essential (primary) hypertension: I10

## 2010-08-24 HISTORY — DX: Unspecified malignant neoplasm of skin, unspecified: C44.90

## 2010-08-24 HISTORY — DX: Gastro-esophageal reflux disease without esophagitis: K21.9

## 2010-08-24 HISTORY — DX: Depression, unspecified: F32.A

## 2010-08-24 MED ORDER — HYDROCODONE-ACETAMINOPHEN 5-325 MG PO TABS: 1.0000 | ORAL_TABLET | Freq: Four times a day (QID) | ORAL | Status: AC | PRN

## 2010-08-24 MED ORDER — DOXYCYCLINE HYCLATE 100 MG PO CAPS: 100.0000 mg | ORAL_CAPSULE | Freq: Two times a day (BID) | ORAL | Status: AC

## 2010-08-24 MED ORDER — HYDROCODONE-ACETAMINOPHEN 5-325 MG PO TABS
1.0000 | ORAL_TABLET | Freq: Once | ORAL | Status: AC
  Administered 2010-08-24: 1 via ORAL

## 2010-08-24 MED ORDER — HYDROCODONE-ACETAMINOPHEN 5-325 MG PO TABS
ORAL_TABLET | ORAL | Status: AC
  Filled 2010-08-24: qty 1

## 2010-08-24 NOTE — ED Notes (Signed)
Pt states she has had a headache the last 4 days.   Took allegra and tylenol with relief several days ago.  Today, headache returned and is the worse headache she has ever had.  Pain is in the left temporal area radiating into her posterior cervical spine.   Took tylenol and benadryl today with only minimal relief.

## 2010-08-24 NOTE — ED Provider Notes (Addendum)
History     Chief Complaint  Patient presents with  . Headache    Headache started on 08/20/10 left side of head.  Took allegra and tylenol x 1 with relief.  Today, headache returned and is the worse headache she has ever had.  Pain in left temporal area radiating into posterior cervical spine.  Took tylenol and benadryl today with only mild relief.   Patient is a 70 y.o. female presenting with headaches. The history is provided by the patient and a relative.  Headache  This is a new problem. The current episode started more than 2 days ago (08/20/10). The problem has been gradually worsening. The headache is associated with nothing. The pain is located in the left unilateral region. The quality of the pain is described as throbbing. The pain is at a severity of 6/10. The pain is moderate. The pain radiates to the right neck. Associated symptoms include nausea. Pertinent negatives include no fever, no chest pressure, no near-syncope, no syncope, no shortness of breath and no vomiting. She has tried acetaminophen for the symptoms. The treatment provided moderate relief.    Past Medical History  Diagnosis Date  . Hypertension   . Depression     no problems at this time  . GERD (gastroesophageal reflux disease)     no problems at this time.  . Skin cancer     Past Surgical History  Procedure Date  . Joint replacement   . Colon surgery   . Breast surgery   . Thyroid surgery   . Skin cancer excision     History reviewed. No pertinent family history.  History  Substance Use Topics  . Smoking status: Former Smoker    Quit date: 08/23/2004  . Smokeless tobacco: Not on file  . Alcohol Use: No    OB History    Grav Para Term Preterm Abortions TAB SAB Ect Mult Living                  Review of Systems  Constitutional: Negative for fever.  HENT: Positive for neck pain. Negative for ear pain, congestion and neck stiffness.   Eyes: Positive for photophobia. Negative for pain and  redness.  Respiratory: Negative for cough and shortness of breath.   Cardiovascular: Negative for chest pain, syncope and near-syncope.  Gastrointestinal: Positive for nausea. Negative for vomiting.  Genitourinary: Negative for dysuria.  Musculoskeletal: Negative for back pain.  Skin: Negative for rash.  Neurological: Positive for headaches. Negative for speech difficulty and weakness.  Psychiatric/Behavioral: Negative for confusion.    Physical Exam  BP 128/62  Pulse 92  Temp(Src) 98.4 F (36.9 C) (Oral)  Resp 18  Ht 5' 7.5" (1.715 m)  Wt 179 lb (81.194 kg)  BMI 27.62 kg/m2  Physical Exam  Constitutional: She is oriented to person, place, and time. She appears well-developed and well-nourished. No distress.  HENT:  Head: Normocephalic and atraumatic.  Right Ear: External ear normal.  Left Ear: External ear normal.  Eyes: Conjunctivae and EOM are normal. Pupils are equal, round, and reactive to light.  Neck: Normal range of motion. Neck supple.  Cardiovascular: Normal rate, regular rhythm, normal heart sounds and intact distal pulses.   No murmur heard. Pulmonary/Chest: She has wheezes. She has no rales. She exhibits no tenderness.  Abdominal: Soft. Bowel sounds are normal. There is no tenderness.  Musculoskeletal: Normal range of motion. She exhibits no edema and no tenderness.  Neurological: She is alert and oriented to person,  place, and time. No cranial nerve deficit. She exhibits normal muscle tone.  Skin: Skin is warm and dry. No rash noted.    ED Course  Procedures  MDM HEADACHE ONSET 7/5 ON OFF RESOLVED 2 DAYS AGO FOR MOST PART AND NOW BACK TODAY AND WORSE THIS AM BUT NOW BETTER. AT [facility]EM RX WIT TYLENOL AND BENADRYL. PATIENT THOUGHT SINUS RELATED BUT NOT REALLY IN SINUS AREA OTHER THAN PERHAPS MASTOID AREA. CT HEAD NECK AND LABS PENDING.   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       Shelda Jakes, MD 08/24/10 1901  Shelda Jakes, MD 08/24/10 1929  Shelda Jakes, MD 08/24/10 1940  Shelda Jakes, MD 08/24/10 [phone]

## 2010-08-26 ENCOUNTER — Telehealth (HOSPITAL_BASED_OUTPATIENT_CLINIC_OR_DEPARTMENT_OTHER): Payer: Self-pay | Admitting: Emergency Medicine

## 2010-08-26 NOTE — ED Notes (Signed)
Pt called wanting nausea meds was prescribed vibramyacin and hydrocodone. Spoke with Dr Norlene Campbell who gave verbal order to call in zofran 4 mg po q 8 hours for nausea then attempt to take the vibramyacin. Prescription called in to [facility] on [address] pt notified and will go pick it up

## 2010-11-06 LAB — I-STAT 8, (EC8 V) (CONVERTED LAB)
BUN: 22
Bicarbonate: 34.6 — ABNORMAL HIGH
Glucose, Bld: 105 — ABNORMAL HIGH
pCO2, Ven: 50.1 — ABNORMAL HIGH
pH, Ven: 7.447 — ABNORMAL HIGH

## 2011-09-20 ENCOUNTER — Other Ambulatory Visit: Payer: Self-pay | Admitting: Oncology

## 2011-09-20 DIAGNOSIS — Z1231 Encounter for screening mammogram for malignant neoplasm of breast: Secondary | ICD-10-CM

## 2011-09-30 ENCOUNTER — Ambulatory Visit
Admission: RE | Admit: 2011-09-30 | Discharge: 2011-09-30 | Disposition: A | Discharge status: Home / Self Care | Payer: Medicare Other | Source: Ambulatory Visit | Attending: Oncology | Admitting: Oncology

## 2011-09-30 DIAGNOSIS — Z1231 Encounter for screening mammogram for malignant neoplasm of breast: Secondary | ICD-10-CM

## 2011-10-04 ENCOUNTER — Other Ambulatory Visit: Payer: Self-pay | Admitting: Oncology

## 2011-10-04 DIAGNOSIS — R928 Other abnormal and inconclusive findings on diagnostic imaging of breast: Secondary | ICD-10-CM

## 2011-10-07 ENCOUNTER — Ambulatory Visit
Admission: RE | Admit: 2011-10-07 | Discharge: 2011-10-07 | Disposition: A | Discharge status: Home / Self Care | Payer: Medicare Other | Source: Ambulatory Visit | Attending: Oncology | Admitting: Oncology

## 2011-10-07 ENCOUNTER — Other Ambulatory Visit: Payer: Self-pay | Admitting: Oncology

## 2011-10-07 DIAGNOSIS — R921 Mammographic calcification found on diagnostic imaging of breast: Secondary | ICD-10-CM

## 2011-10-07 DIAGNOSIS — R928 Other abnormal and inconclusive findings on diagnostic imaging of breast: Secondary | ICD-10-CM

## 2011-10-08 ENCOUNTER — Emergency Department (HOSPITAL_COMMUNITY)
Admission: EM | Admit: 2011-10-08 | Discharge: 2011-10-08 | Disposition: A | Discharge status: Home / Self Care | Payer: Medicare Other | Attending: Emergency Medicine | Admitting: Emergency Medicine

## 2011-10-08 ENCOUNTER — Emergency Department (HOSPITAL_COMMUNITY): Payer: Medicare Other

## 2011-10-08 ENCOUNTER — Encounter (HOSPITAL_COMMUNITY): Payer: Self-pay

## 2011-10-08 DIAGNOSIS — F329 Major depressive disorder, single episode, unspecified: Secondary | ICD-10-CM | POA: Insufficient documentation

## 2011-10-08 DIAGNOSIS — F3289 Other specified depressive episodes: Secondary | ICD-10-CM | POA: Insufficient documentation

## 2011-10-08 DIAGNOSIS — Z96649 Presence of unspecified artificial hip joint: Secondary | ICD-10-CM | POA: Insufficient documentation

## 2011-10-08 DIAGNOSIS — Z85820 Personal history of malignant melanoma of skin: Secondary | ICD-10-CM | POA: Insufficient documentation

## 2011-10-08 DIAGNOSIS — Z87891 Personal history of nicotine dependence: Secondary | ICD-10-CM | POA: Insufficient documentation

## 2011-10-08 DIAGNOSIS — T84029A Dislocation of unspecified internal joint prosthesis, initial encounter: Secondary | ICD-10-CM | POA: Insufficient documentation

## 2011-10-08 DIAGNOSIS — Y92009 Unspecified place in unspecified non-institutional (private) residence as the place of occurrence of the external cause: Secondary | ICD-10-CM | POA: Insufficient documentation

## 2011-10-08 DIAGNOSIS — I10 Essential (primary) hypertension: Secondary | ICD-10-CM | POA: Insufficient documentation

## 2011-10-08 DIAGNOSIS — W19XXXA Unspecified fall, initial encounter: Secondary | ICD-10-CM | POA: Insufficient documentation

## 2011-10-08 DIAGNOSIS — K219 Gastro-esophageal reflux disease without esophagitis: Secondary | ICD-10-CM | POA: Insufficient documentation

## 2011-10-08 LAB — BASIC METABOLIC PANEL
CO2: 30 mEq/L (ref 19–32)
Calcium: 9.9 mg/dL (ref 8.4–10.5)
Creatinine, Ser: 1 mg/dL (ref 0.50–1.10)
GFR calc Af Amer: 63 mL/min — ABNORMAL LOW (ref >90)
GFR calc non Af Amer: 55 mL/min — ABNORMAL LOW (ref >90)

## 2011-10-08 LAB — CBC
MCH: 31.9 pg (ref 26.0–34.0)
MCHC: 34.1 g/dL (ref 30.0–36.0)
MCV: 93.7 fL (ref 78.0–100.0)
Platelets: 232 10*3/uL (ref 150–400)
RDW: 13.5 % (ref 11.5–15.5)

## 2011-10-08 MED ORDER — NALOXONE HCL 1 MG/ML IJ SOLN
INTRAMUSCULAR | Status: AC
  Administered 2011-10-08: 16:00:00
  Filled 2011-10-08: qty 2

## 2011-10-08 MED ORDER — ONDANSETRON HCL 4 MG/2ML IJ SOLN
INTRAMUSCULAR | Status: AC
  Administered 2011-10-08: 16:00:00
  Filled 2011-10-08: qty 2

## 2011-10-08 MED ORDER — PROPOFOL 10 MG/ML IV BOLUS
1.0000 mg/kg | Freq: Once | INTRAVENOUS | Status: AC
  Filled 2011-10-08: qty 1

## 2011-10-08 MED ORDER — SODIUM CHLORIDE 0.9 % IV BOLUS (SEPSIS)
500.0000 mL | Freq: Once | INTRAVENOUS | Status: AC
  Administered 2011-10-08: 500 mL via INTRAVENOUS

## 2011-10-08 MED ORDER — PROPOFOL 10 MG/ML IV BOLUS
INTRAVENOUS | Status: AC | PRN
  Administered 2011-10-08: 50 mg via INTRAVENOUS

## 2011-10-08 MED ORDER — ONDANSETRON HCL 4 MG/2ML IJ SOLN
4.0000 mg | Freq: Once | INTRAMUSCULAR | Status: AC
  Administered 2011-10-08: 4 mg via INTRAVENOUS
  Filled 2011-10-08: qty 2

## 2011-10-08 MED ORDER — MORPHINE SULFATE 4 MG/ML IJ SOLN
6.0000 mg | Freq: Once | INTRAMUSCULAR | Status: AC
  Administered 2011-10-08: 6 mg via INTRAVENOUS
  Filled 2011-10-08: qty 2

## 2011-10-08 NOTE — ED Notes (Signed)
Dr. Patria Mane left the room, Ortho tech has applied the knee immobilizer

## 2011-10-08 NOTE — ED Notes (Signed)
GNF:[id number]<YQ> Expected date:10/08/11<BR> Expected time: 3:22 PM<BR> Means of arrival:Ambulance<BR> Comments:<BR> ems lathargic

## 2011-10-08 NOTE — ED Notes (Signed)
Ems was called for Fall. Pt fell today while reaching down to open a draw, fell backward, no head injury, no LOC, does not take any blood thinner. Pain at right hip, hip replacement 2 years ago, deformity noted. Pain worsen with movement with limited ROM on right side. EMS gave total of 150 mcg of Fentanyl separately in three intervals enroute. During the 2nd round of fentanyl pt felt nausea,4mg  zofran was given as the result. During the 3rd round of Fentanyl, pt became lethargic. 2mg  of Narcane was given and pt soon returned back to her normal self.

## 2011-10-08 NOTE — ED Provider Notes (Signed)
History     CSN: [id number]  Arrival date & time 10/08/11  1535   First MD Initiated Contact with Patient 10/08/11 1642      Chief Complaint  Patient presents with  . Fall    HPI Patient reports slipping and falling backwards in her house earlier today.  She reports severe pain and deformity in her right hip.  She has a prosthetic right hip.  She denies numbness tingling or weakness of her right lower extremity.  She denies neck pain.  She has no weakness of her upper lower extremities.  She did not lose consciousness.  The patient was treated with fentanyl by EMS and required Narcan secondary to lethargy.  Patient also had her nausea treated in route by EMS with Zofran.  She denies use of anticoagulants    Past Medical History  Diagnosis Date  . Hypertension   . Depression     no problems at this time  . GERD (gastroesophageal reflux disease)     no problems at this time.  . Skin cancer     Past Surgical History  Procedure Date  . Joint replacement   . Colon surgery   . Breast surgery   . Thyroid surgery   . Skin cancer excision     No family history on file.  History  Substance Use Topics  . Smoking status: Former Smoker    Quit date: 08/23/2004  . Smokeless tobacco: Not on file  . Alcohol Use: No    OB History    Grav Para Term Preterm Abortions TAB SAB Ect Mult Living                  Review of Systems  All other systems reviewed and are negative.    Allergies  Aspirin  Home Medications   Current Outpatient Rx  Name Route Sig Dispense Refill  . CLONAZEPAM 1 MG PO TABS Oral Take 1 mg by mouth at bedtime. Anxiety    . DIPHENHYDRAMINE HCL 50 MG PO CAPS Oral Take 100 mg by mouth at bedtime.     Marland Kitchen LEVOTHYROXINE SODIUM 100 MCG PO TABS Oral Take 100 mcg by mouth daily.     . MULTI-VITAMIN/MINERALS PO TABS Oral Take 1 tablet by mouth daily.     . TRIAMTERENE-HCTZ 37.5-25 MG PO CAPS Oral Take 1 capsule by mouth every morning.        BP 137/77  Pulse  99  Temp 97.5 F (36.4 C) (Oral)  Resp 25  Wt 196 lb (88.905 kg)  SpO2 100%  Physical Exam  Nursing note and vitals reviewed. Constitutional: She is oriented to person, place, and time. She appears well-developed and well-nourished. No distress.  HENT:  Head: Normocephalic and atraumatic.  Eyes: EOM are normal.  Neck: Normal range of motion.  Cardiovascular: Normal rate, regular rhythm and normal heart sounds.   Pulmonary/Chest: Effort normal and breath sounds normal.  Abdominal: Soft. She exhibits no distension. There is no tenderness.  Musculoskeletal:       Obvious deformity of right hip with internal rotation and shortening.  Normal pulses in her right foot  Neurological: She is alert and oriented to person, place, and time.  Skin: Skin is warm and dry.  Psychiatric: She has a normal mood and affect. Judgment normal.    ED Course  Reduction of dislocation Performed by: Lyanne Co Authorized by: Lyanne Co Consent: Verbal consent obtained. Risks and benefits: risks, benefits and alternatives were discussed  Consent given by: patient Required items: required blood products, implants, devices, and special equipment available Patient identity confirmed: verbally with patient Time out: Immediately prior to procedure a "time out" was called to verify the correct patient, procedure, equipment, support staff and site/side marked as required. Patient sedated: yes (See note) Vitals: Vital signs were monitored during sedation. Patient tolerance: Patient tolerated the procedure well with no immediate complications. Comments: Right hip prosthetic hip dislocation reduction performed with Darrel Reach technique    Procedural sedation Performed by: Lyanne Co Consent: Verbal consent obtained. Risks and benefits: risks, benefits and alternatives were discussed Required items: required blood products, implants, devices, and special equipment available Patient identity  confirmed: arm band and provided demographic data Time out: Immediately prior to procedure a "time out" was called to verify the correct patient, procedure, equipment, support staff and site/side marked as required. Sedation type: moderate (conscious) sedation NPO time confirmed and considered Sedatives: PROPOFOL Physician Time at Bedside: 20 Vitals: Vital signs were monitored during sedation. Cardiac Monitor, pulse oximeter Patient tolerance: Patient tolerated the procedure well with no immediate complications. Comments: Pt with uneventful recovery. Returned to pre-procedural sedation baseline     Labs Reviewed  CBC - Abnormal; Notable for the following:    WBC 19.6 (*)     Hemoglobin 15.3 (*)     All other components within normal limits  BASIC METABOLIC PANEL - Abnormal; Notable for the following:    Potassium 2.9 (*)     Glucose, Bld 145 (*)     GFR calc non Af Amer 55 (*)     GFR calc Af Amer 63 (*)     All other components within normal limits   Dg Hip Complete Right  10/08/2011  *RADIOLOGY REPORT*  Clinical Data: History of right hip pain and deformity.  Previous total hip replacement.  RIGHT HIP - COMPLETE 2+ VIEW  Comparison: 09/18/2009.  Findings: The patient has undergone previous right total hip arthroplasty procedure.  There is dislocation of the femoral component compared to the acetabular component.  There is proximal and lateral displacement of the femur and femoral component.  No fracture is evident.  There is an osteopenic appearance of bones. There is degenerative spondylosis.  IMPRESSION: Post right hip arthroplasty procedure.  Dislocation of femoral component in relation to acetabular component on the right.   Original Report Authenticated By: Crawford Givens, M.D.    Dg Hip 1 View Right  10/08/2011  *RADIOLOGY REPORT*  Clinical Data: 71 year old female status post reduction of the right hip.  RIGHT HIP - 1 VIEW  Comparison: 1713 hours the same day and earlier.  Findings:  Single AP view of the right hip.  Reduced right total hip arthroplasty dislocation based on this single image.  Hardware appears intact.  No acute fracture identified.  IMPRESSION: Reduced appearance of the right total hip arthroplasty on this single AP view.   Original Report Authenticated By: Harley Hallmark, M.D.     I personally reviewed the imaging tests through PACS system  I reviewed available ER/hospitalization records thought the EMR   1. Dislocation of hip joint prosthesis       MDM  Prosthetic right hip dislocation reduction in the bedside without difficulty.  No C-spine tenderness.  C-spine cleared by Nexus criteria.  No other complaints.  Normal pulses in right foot both pre-and postprocedure.  Close followup with her orthopedic surgeon        Lyanne Co, MD 10/08/11 [phone]

## 2011-10-08 NOTE — ED Notes (Addendum)
Dr. Patria Mane had sucessfully reduced the dislocation.

## 2011-10-08 NOTE — ED Notes (Signed)
Dr. Patria Mane and this Rn at bedside waiting for ortho tech and the orthopedic surgeon.

## 2011-10-08 NOTE — ED Notes (Signed)
Daughter back at bedside

## 2011-10-08 NOTE — ED Notes (Signed)
All staff at bedside, ready to reduce the dislocation

## 2011-10-08 NOTE — Progress Notes (Signed)
pcp is [facility] at [facility] per pt

## 2011-10-14 ENCOUNTER — Ambulatory Visit
Admission: RE | Admit: 2011-10-14 | Discharge: 2011-10-14 | Disposition: A | Discharge status: Home / Self Care | Payer: Medicare Other | Source: Ambulatory Visit | Attending: Oncology | Admitting: Oncology

## 2011-10-14 DIAGNOSIS — R921 Mammographic calcification found on diagnostic imaging of breast: Secondary | ICD-10-CM

## 2011-10-29 ENCOUNTER — Emergency Department (HOSPITAL_COMMUNITY): Payer: Medicare Other

## 2011-10-29 ENCOUNTER — Emergency Department (HOSPITAL_COMMUNITY)
Admission: EM | Admit: 2011-10-29 | Discharge: 2011-10-29 | Disposition: A | Discharge status: Home / Self Care | Payer: Medicare Other | Attending: Emergency Medicine | Admitting: Emergency Medicine

## 2011-10-29 ENCOUNTER — Encounter (HOSPITAL_COMMUNITY): Payer: Self-pay | Admitting: Emergency Medicine

## 2011-10-29 DIAGNOSIS — S73004A Unspecified dislocation of right hip, initial encounter: Secondary | ICD-10-CM

## 2011-10-29 DIAGNOSIS — X500XXA Overexertion from strenuous movement or load, initial encounter: Secondary | ICD-10-CM | POA: Insufficient documentation

## 2011-10-29 DIAGNOSIS — Z96649 Presence of unspecified artificial hip joint: Secondary | ICD-10-CM | POA: Insufficient documentation

## 2011-10-29 DIAGNOSIS — F3289 Other specified depressive episodes: Secondary | ICD-10-CM | POA: Insufficient documentation

## 2011-10-29 DIAGNOSIS — Z85828 Personal history of other malignant neoplasm of skin: Secondary | ICD-10-CM | POA: Insufficient documentation

## 2011-10-29 DIAGNOSIS — F329 Major depressive disorder, single episode, unspecified: Secondary | ICD-10-CM | POA: Insufficient documentation

## 2011-10-29 DIAGNOSIS — Z87891 Personal history of nicotine dependence: Secondary | ICD-10-CM | POA: Insufficient documentation

## 2011-10-29 DIAGNOSIS — I10 Essential (primary) hypertension: Secondary | ICD-10-CM | POA: Insufficient documentation

## 2011-10-29 DIAGNOSIS — K219 Gastro-esophageal reflux disease without esophagitis: Secondary | ICD-10-CM | POA: Insufficient documentation

## 2011-10-29 DIAGNOSIS — Z79899 Other long term (current) drug therapy: Secondary | ICD-10-CM | POA: Insufficient documentation

## 2011-10-29 DIAGNOSIS — T84029A Dislocation of unspecified internal joint prosthesis, initial encounter: Secondary | ICD-10-CM | POA: Insufficient documentation

## 2011-10-29 MED ORDER — ONDANSETRON HCL 4 MG/2ML IJ SOLN
4.0000 mg | Freq: Once | INTRAMUSCULAR | Status: AC
  Administered 2011-10-29: 4 mg via INTRAVENOUS

## 2011-10-29 MED ORDER — ONDANSETRON HCL 4 MG/2ML IJ SOLN
INTRAMUSCULAR | Status: AC
  Administered 2011-10-29: 4 mg via INTRAVENOUS
  Filled 2011-10-29: qty 2

## 2011-10-29 MED ORDER — PROPOFOL 10 MG/ML IV BOLUS
44.5000 mg | Freq: Once | INTRAVENOUS | Status: DC
Start: 2011-10-29 — End: 2011-10-30
  Filled 2011-10-29: qty 1

## 2011-10-29 NOTE — ED Notes (Addendum)
Patient ambulated to the bathroom and back to her room without difficulty. Patient ambulated to the back to her bed without assistance. Patient walked with steady gait.

## 2011-10-29 NOTE — ED Notes (Signed)
Patient transported to X-ray 

## 2011-10-29 NOTE — ED Notes (Signed)
Pt. Ambulated within room with steady gait without assistance.

## 2011-10-29 NOTE — ED Provider Notes (Addendum)
History     CSN: [id number]  Arrival date & time 10/29/11  1601   First MD Initiated Contact with Patient 10/29/11 1618      Chief Complaint  Patient presents with  . Hip Injury    (Consider location/radiation/quality/duration/timing/severity/associated sxs/prior treatment) Patient is a 71 y.o. female presenting with hip pain. The history is provided by the patient.  Hip Pain This is a recurrent problem. The current episode started less than 1 hour ago. The problem occurs constantly. The problem has not changed since onset.Associated symptoms comments: Was bending over the wrong way and her prosthesis went out.  Same thing happened 3 weeks ago. The symptoms are aggravated by twisting and bending (movement). The symptoms are relieved by relaxation and narcotics. Treatments tried: 10mg  of morphine by EMS. The treatment provided significant relief.    Past Medical History  Diagnosis Date  . Hypertension   . Depression     no problems at this time  . GERD (gastroesophageal reflux disease)     no problems at this time.  . Skin cancer     Past Surgical History  Procedure Date  . Joint replacement   . Colon surgery   . Breast surgery   . Thyroid surgery   . Skin cancer excision     No family history on file.  History  Substance Use Topics  . Smoking status: Former Smoker    Quit date: 08/23/2004  . Smokeless tobacco: Not on file  . Alcohol Use: No    OB History    Grav Para Term Preterm Abortions TAB SAB Ect Mult Living                  Review of Systems  All other systems reviewed and are negative.    Allergies  Aspirin  Home Medications   Current Outpatient Rx  Name Route Sig Dispense Refill  . CLONAZEPAM 1 MG PO TABS Oral Take 1 mg by mouth at bedtime. Anxiety    . DIPHENHYDRAMINE HCL 50 MG PO CAPS Oral Take 100 mg by mouth at bedtime.     Marland Kitchen LEVOTHYROXINE SODIUM 100 MCG PO TABS Oral Take 100 mcg by mouth daily.     . MULTI-VITAMIN/MINERALS PO TABS  Oral Take 1 tablet by mouth daily.     . TRIAMTERENE-HCTZ 37.5-25 MG PO CAPS Oral Take 1 capsule by mouth every morning.        BP 130/58  Pulse 104  Temp 97.7 F (36.5 C)  Resp 22  SpO2 91%  Physical Exam  Nursing note and vitals reviewed. Constitutional: She is oriented to person, place, and time. She appears well-developed and well-nourished. No distress.  HENT:  Head: Normocephalic and atraumatic.  Mouth/Throat: Oropharynx is clear and moist.  Eyes: Conjunctivae normal and EOM are normal. Pupils are equal, round, and reactive to light.  Neck: Normal range of motion. Neck supple.  Cardiovascular: Normal rate, regular rhythm and intact distal pulses.   No murmur heard. Pulmonary/Chest: Effort normal and breath sounds normal. No respiratory distress. She has no wheezes. She has no rales.  Abdominal: Soft. She exhibits no distension. There is no tenderness. There is no rebound and no guarding.  Musculoskeletal: Normal range of motion. She exhibits no edema and no tenderness.       Right hip: She exhibits tenderness, bony tenderness and deformity.       Right hip is internally rotated and shortened.  Normal DP and PT pulses  Neurological: She is  alert and oriented to person, place, and time.  Skin: Skin is warm and dry. No rash noted. No erythema.  Psychiatric: She has a normal mood and affect. Her behavior is normal.    ED Course  Reduction of dislocation Date/Time: 10/29/2011 7:54 PM Performed by: Gwyneth Sprout Authorized by: Gwyneth Sprout Consent: Verbal consent obtained. Risks and benefits: risks, benefits and alternatives were discussed Consent given by: patient Patient understanding: patient states understanding of the procedure being performed Patient consent: the patient's understanding of the procedure matches consent given Procedure consent: procedure consent matches procedure scheduled Imaging studies: imaging studies available Patient identity confirmed:  verbally with patient Time out: Immediately prior to procedure a "time out" was called to verify the correct patient, procedure, equipment, support staff and site/side marked as required. Preparation: Patient was prepped and draped in the usual sterile fashion. Local anesthesia used: no Patient sedated: yes Patient tolerance: Patient tolerated the procedure well with no immediate complications. Comments: Right hip prosthesis was reduced with hyperflexion   (including critical care time)  Labs Reviewed - No data to display Dg Hip 1 View Right  10/29/2011  *RADIOLOGY REPORT*  Clinical Data: Post reduction  RIGHT HIP - 1 VIEW  Comparison: 1850 hours  Findings: There is now anatomic alignment of the right total hip arthroplasty.  No breakage or loosening of the hardware.  IMPRESSION: Anatomic reduction of the right total hip arthroplasty.   Original Report Authenticated By: Donavan Burnet, M.D.    Dg Femur Right  10/29/2011  *RADIOLOGY REPORT*  Clinical Data: Right hip dislocation - status post reduction.  RIGHT FEMUR - 2 VIEW  Comparison: Films earlier this date.  Findings: The right total hip arthroplasty is located. There is no evidence of acute fracture, subluxation or dislocation. No complicating hardware features are identified. Diffuse osteopenia is present.  IMPRESSION: Right total hip arthroplasty without definite complicating features.   Original Report Authenticated By: Rosendo Gros, M.D.    Dg Hip Portable 1 View Right  10/29/2011  *RADIOLOGY REPORT*  Clinical Data: Severe right hip pain.  PORTABLE RIGHT HIP - 1 VIEW 10/29/2011 1655 hours:  Comparison: Right hip x-rays 10/08/2011.  Findings: Superior dislocation of the right hip prosthesis.  No visible associated fractures.  IMPRESSION: Dislocation of the right hip prosthesis superiorly.   Original Report Authenticated By: Arnell Sieving, M.D.    Procedural sedation Performed by: Gwyneth Sprout Consent: Verbal consent  obtained. Risks and benefits: risks, benefits and alternatives were discussed Required items: required blood products, implants, devices, and special equipment available Patient identity confirmed: arm band and provided demographic data Time out: Immediately prior to procedure a "time out" was called to verify the correct patient, procedure, equipment, support staff and site/side marked as required.  Sedation type: moderate (conscious) sedation NPO time confirmed and considedered  Sedatives: PROPOFOL  Physician Time at Bedside: 30  Vitals: Vital signs were monitored during sedation. Cardiac Monitor, pulse oximeter Patient tolerance: Patient tolerated the procedure well with no immediate complications. Comments: Pt with uneventful recovered. Returned to pre-procedural sedation baseline     1. Hip dislocation, right       MDM   Patient bends over round and her prosthetic hip became dislocated. Her right leg is shortened and rotated.. She states the same thing happened 3 weeks ago and her hip was reduced without difficulty.  Film of the hip pending. Patient was given 10 mg of morphine in the field and will do propofol a very low dose.  7:53 PM Pt was consciously sedated and hip reduced with good placement per repeat film.  9:55 PM Spoke with [place] ortho and they recommended getting a complete femur film despite pt ambulating without difficulty and it was wnl and will d/c home.   Gwyneth Sprout, MD 10/29/11 1954  Gwyneth Sprout, MD 10/29/11 [id number]  Gwyneth Sprout, MD 10/29/11 2243

## 2011-10-29 NOTE — ED Notes (Signed)
Bed:WHALA<BR> Expected date:10/29/11<BR> Expected time:<BR> Means of arrival:<BR> Comments:<BR> Female Hip dislocation from home by EMS.

## 2011-10-29 NOTE — ED Notes (Signed)
Pt states she was bending over and she bent her knees she felt a pop and then noticed that her hip was out. Pt states she did not fall. Pt states she was here for the same x 3 weeks ago.

## 2011-10-29 NOTE — Consult Note (Signed)
Spoke with Dr. Anitra Lauth concerning this patient Briefly: Elderly woman with THR s/p postural indiscretion with hip dislocation Reduced in the ER by Dr. Denzil Hughes to discuss f/u arrangements Plan: recommend full femur xrays to confirm no periprosthetic fracture as well as lateral hip to confirm reduction If xrays satisfactory agreed with her plan of discharge with knee immobilizer Patient will call Dr Ciro Backer on Monday to set up f/u arrangements ([phone]) Asked Dr Anitra Lauth if she would like me to see patient - she did not feel that this would be necessary If xrays show abnormality she knows to contact me for further instruction.

## 2011-10-29 NOTE — ED Notes (Signed)
Pt more awake at this time. Pt sitting up in bed. Pt awaiting repeat xray at this time. Pt with family at bedside.

## 2011-11-16 ENCOUNTER — Other Ambulatory Visit: Payer: Self-pay | Admitting: Family Medicine

## 2011-11-16 DIAGNOSIS — M25559 Pain in unspecified hip: Secondary | ICD-10-CM

## 2011-11-16 DIAGNOSIS — M549 Dorsalgia, unspecified: Secondary | ICD-10-CM

## 2011-11-16 DIAGNOSIS — IMO0002 Reserved for concepts with insufficient information to code with codable children: Secondary | ICD-10-CM

## 2011-11-19 ENCOUNTER — Other Ambulatory Visit: Payer: Medicare Other

## 2011-12-06 ENCOUNTER — Ambulatory Visit
Admission: RE | Admit: 2011-12-06 | Discharge: 2011-12-06 | Disposition: A | Discharge status: Home / Self Care | Payer: Medicare Other | Source: Ambulatory Visit | Attending: Family Medicine | Admitting: Family Medicine

## 2011-12-06 DIAGNOSIS — M549 Dorsalgia, unspecified: Secondary | ICD-10-CM

## 2011-12-06 DIAGNOSIS — M25559 Pain in unspecified hip: Secondary | ICD-10-CM

## 2011-12-06 DIAGNOSIS — IMO0002 Reserved for concepts with insufficient information to code with codable children: Secondary | ICD-10-CM

## 2012-06-01 ENCOUNTER — Emergency Department (HOSPITAL_COMMUNITY): Payer: Medicare Other

## 2012-06-01 ENCOUNTER — Emergency Department (HOSPITAL_COMMUNITY)
Admission: EM | Admit: 2012-06-01 | Discharge: 2012-06-01 | Disposition: A | Discharge status: Home / Self Care | Payer: Medicare Other | Attending: Emergency Medicine | Admitting: Emergency Medicine

## 2012-06-01 ENCOUNTER — Encounter (HOSPITAL_COMMUNITY): Payer: Self-pay | Admitting: *Deleted

## 2012-06-01 DIAGNOSIS — Z96649 Presence of unspecified artificial hip joint: Secondary | ICD-10-CM

## 2012-06-01 DIAGNOSIS — Z79899 Other long term (current) drug therapy: Secondary | ICD-10-CM | POA: Insufficient documentation

## 2012-06-01 DIAGNOSIS — F329 Major depressive disorder, single episode, unspecified: Secondary | ICD-10-CM | POA: Insufficient documentation

## 2012-06-01 DIAGNOSIS — Z87891 Personal history of nicotine dependence: Secondary | ICD-10-CM | POA: Insufficient documentation

## 2012-06-01 DIAGNOSIS — I10 Essential (primary) hypertension: Secondary | ICD-10-CM | POA: Insufficient documentation

## 2012-06-01 DIAGNOSIS — X500XXA Overexertion from strenuous movement or load, initial encounter: Secondary | ICD-10-CM | POA: Insufficient documentation

## 2012-06-01 DIAGNOSIS — K219 Gastro-esophageal reflux disease without esophagitis: Secondary | ICD-10-CM | POA: Insufficient documentation

## 2012-06-01 DIAGNOSIS — T84029A Dislocation of unspecified internal joint prosthesis, initial encounter: Secondary | ICD-10-CM | POA: Insufficient documentation

## 2012-06-01 DIAGNOSIS — Z85828 Personal history of other malignant neoplasm of skin: Secondary | ICD-10-CM | POA: Insufficient documentation

## 2012-06-01 DIAGNOSIS — F3289 Other specified depressive episodes: Secondary | ICD-10-CM | POA: Insufficient documentation

## 2012-06-01 DIAGNOSIS — Y92009 Unspecified place in unspecified non-institutional (private) residence as the place of occurrence of the external cause: Secondary | ICD-10-CM | POA: Insufficient documentation

## 2012-06-01 DIAGNOSIS — Y9301 Activity, walking, marching and hiking: Secondary | ICD-10-CM | POA: Insufficient documentation

## 2012-06-01 MED ORDER — ETOMIDATE 2 MG/ML IV SOLN
8.0000 mg | Freq: Once | INTRAVENOUS | Status: AC
  Administered 2012-06-01: 8 mg via INTRAVENOUS
  Filled 2012-06-01: qty 10

## 2012-06-01 MED ORDER — MIDAZOLAM HCL 2 MG/2ML IJ SOLN
2.0000 mg | Freq: Once | INTRAMUSCULAR | Status: AC
  Administered 2012-06-01: 2 mg via INTRAVENOUS
  Filled 2012-06-01: qty 2

## 2012-06-01 MED ORDER — MORPHINE SULFATE 4 MG/ML IJ SOLN: 4.0000 mg | Freq: Once | INTRAMUSCULAR | Status: DC

## 2012-06-01 MED ORDER — FENTANYL CITRATE 0.05 MG/ML IJ SOLN: 25.0000 ug | Freq: Once | INTRAMUSCULAR | Status: DC

## 2012-06-01 NOTE — ED Provider Notes (Signed)
History     CSN: [id number]  Arrival date & time 06/01/12  1803   First MD Initiated Contact with Patient 06/01/12 1809      Chief Complaint  Patient presents with  . hip deformity     (Consider location/radiation/quality/duration/timing/severity/associated sxs/prior treatment) HPI Comments: Level 5 caveat due to condition of patient and severe pain.  Pt had hip replacement by Dr. Darrelyn Hillock in August 2011, was trying to shift and roll herself in bed and had right hip pop out.  No falls.  Pt has had 2 prior dislocations in the past, each fixed after sedation in the ED.  Pt has currently 10/10 pain.  Pt did received morphine 6 mg by EMS en route with some improvement.  Pt's sats were in the mid 80's upon arrival, improved to 96% on Trail O2.  Pt doesn't use home O2.  Otherwise has been in usual state of health.  She last ate at 1 PM, sip of kool aid about 1.5 hrs ago.  Currently, no CP, SOB, bd pain.  She was nauseated adn EMS gave zofran as well and improved here.  Pt had N/V with fentanyl in the past.    The history is provided by the patient, the EMS personnel and a relative.    Past Medical History  Diagnosis Date  . Hypertension   . Depression     no problems at this time  . GERD (gastroesophageal reflux disease)     no problems at this time.  . Skin cancer     Past Surgical History  Procedure Laterality Date  . Joint replacement    . Colon surgery    . Thyroid surgery    . Skin cancer excision    . Breast surgery      left extremity restriction    History reviewed. No pertinent family history.  History  Substance Use Topics  . Smoking status: Former Smoker    Quit date: 08/23/2004  . Smokeless tobacco: Not on file  . Alcohol Use: No    OB History   Grav Para Term Preterm Abortions TAB SAB Ect Mult Living                  Review of Systems  Unable to perform ROS: Acuity of condition    Allergies  Fentanyl; Percocet; Vicodin; and Aspirin  Home Medications    Current Outpatient Rx  Name  Route  Sig  Dispense  Refill  . alendronate (FOSAMAX) 70 MG tablet   Oral   Take 70 mg by mouth every 7 (seven) days. Take with a full glass of water on an empty stomach. Tuesday.         . calcium carbonate (TUMS - DOSED IN MG ELEMENTAL CALCIUM) 500 MG chewable tablet   Oral   Chew 2 tablets by mouth once.         . clonazePAM (KLONOPIN) 1 MG tablet   Oral   Take 1 mg by mouth at bedtime. Anxiety         . diphenhydrAMINE (BENADRYL) 25 mg capsule   Oral   Take 25 mg by mouth at bedtime as needed for itching or allergies.         . DULoxetine (CYMBALTA) 60 MG capsule   Oral   Take 60 mg by mouth every evening.         Marland Kitchen ibuprofen (ADVIL,MOTRIN) 200 MG tablet   Oral   Take 600 mg by mouth every 6 (  six) hours as needed for pain.         Marland Kitchen levothyroxine (SYNTHROID, LEVOTHROID) 112 MCG tablet   Oral   Take 112 mcg by mouth daily before breakfast.         . Multiple Vitamin (MULTIVITAMIN WITH MINERALS) TABS   Oral   Take 1 tablet by mouth daily.         Marland Kitchen omeprazole (PRILOSEC) 40 MG capsule   Oral   Take 40 mg by mouth daily.         Marland Kitchen triamterene-hydrochlorothiazide (MAXZIDE-25) 37.5-25 MG per tablet   Oral   Take 1 tablet by mouth daily.           BP 142/74  Pulse 104  Temp(Src) 97.6 F (36.4 C) (Oral)  Resp 18  SpO2 97%  Physical Exam  Nursing note and vitals reviewed. Constitutional: She is oriented to person, place, and time. She appears well-developed and well-nourished.  HENT:  Head: Normocephalic and atraumatic.  Cardiovascular: Normal rate, regular rhythm and intact distal pulses.   No murmur heard. Pulmonary/Chest: Effort normal. No respiratory distress. She has no wheezes. She has no rales.  Abdominal: Soft. She exhibits no distension. There is no tenderness.  Musculoskeletal:       Right hip: She exhibits decreased range of motion, tenderness, bony tenderness and deformity. She exhibits no  laceration.  Shortened and inwardly twisted  Neurological: She is alert and oriented to person, place, and time. Coordination normal.  Skin: Skin is warm. No rash noted.  Psychiatric: She has a normal mood and affect.    ED Course  ORTHOPEDIC INJURY TREATMENT Date/Time: 06/01/2012 7:55 PM Performed by: Lear Ng. Authorized by: Lear Ng Consent: Verbal consent obtained. written consent obtained. Risks and benefits: risks, benefits and alternatives were discussed Consent given by: patient Patient understanding: patient states understanding of the procedure being performed Patient consent: the patient's understanding of the procedure matches consent given Procedure consent: procedure consent matches procedure scheduled Patient identity confirmed: arm band Time out: Immediately prior to procedure a "time out" was called to verify the correct patient, procedure, equipment, support staff and site/side marked as required. Injury location: hip Location details: right hip Injury type: dislocation Dislocation type: posterior Spontaneous dislocation: yes Prosthesis: yes Pre-procedure neurovascular assessment: neurovascularly intact Pre-procedure distal perfusion: normal Pre-procedure neurological function: normal Pre-procedure range of motion: reduced Patient sedated: yes Sedatives: etomidate and midazolam Sedation start date/time: 06/01/2012 7:48 PM Sedation end date/time: 06/01/2012 7:56 PM Vitals: Vital signs were monitored during sedation. Manipulation performed: yes Reduction method: external rotation Reduction successful: yes Immobilization: brace Post-procedure neurovascular assessment: post-procedure neurovascularly intact Patient tolerance: Patient tolerated the procedure well with no immediate complications.   (including critical care time)  Labs Reviewed - No data to display Dg Hip Portable 1 View Right  06/01/2012  *RADIOLOGY REPORT*  Clinical Data: Post  reduction  PORTABLE RIGHT HIP - 1 VIEW  Comparison: Right hip and femur films of 10/29/2011.  Findings: A single portable view shows the acetabular and femoral components of the right hip replacement to be in good position.  No fracture is seen.  IMPRESSION: Right hip replacement components in good position.  No fracture.   Original Report Authenticated By: Dwyane Dee, M.D.    I reviewed the above films myself  1. Recurrent dislocation of hip joint prosthesis, initial encounter     Pulse ox currently at 96% on 4L [place] which is adequate.   8:25 PM prosethis is clinically back  in position confirmed by xray.  Will d/c home.    MDM  Clinically, pt has had likely posterior hip dislocation.  wil proceed with sedation and closed reduction.  Pt and pt's daughter consented verbally and by writing.          Gavin Pound. Bassem Bernasconi, MD 06/01/12 2025

## 2012-06-01 NOTE — ED Notes (Signed)
[id number]<AV> Expected date:<BR> Expected time:<BR> Means of arrival:<BR> Comments:<BR> Hip deformity

## 2012-06-01 NOTE — ED Notes (Signed)
Per ems pt from home. Pt did not fall. Pt reports hx of right hip dislocation and right hip replacement. Pt reports she was walking around the house and heard a pop, sitting on bed when ems arrived, unable to bear weight. IV started, O2 stats 98% on room air, given 1718 4mg  morphine initially 4 mg zofran, then 1732 2 mg morphine administered, O2 92% on room air. Placed on 2 L [place].

## 2012-06-01 NOTE — ED Notes (Signed)
Ortho Neurosurgeon at bedside. Hip reduced per md ghim. Ortho tech to place immobilzer to leg. Pt tolerated well.

## 2012-06-01 NOTE — ED Notes (Signed)
Xray at bedside to obtain post reduction xray

## 2012-06-01 NOTE — Discharge Instructions (Signed)
 Prosthetic Hip Dislocation A dislocated hip prosthesis means that the artificial hip joint (prosthesis) has moved out of place. Once the prosthesis has been put back in place, you may resume your normal activities unless your caregiver tells you otherwise. You may use a cane, crutches, or a walker to relieve your discomfort as recommended by your caregiver. Pain medicine may be prescribed for the next few days. Do not lie on your affected hip until your caregiver approves.  If you were given medicine to help you relax (sedative), you should not drive, operate machinery, or make important decisions for at least 24 hours. You will also need a responsible person to drive you home. The following are tips to reduce the risk of having another dislocation:  Do not cross your legs. Try to keep your knees apart when getting in and out of your car.  Do not lean forward beyond 90 degrees or raise your knee higher than the level of your hip.  Do not sit in low chairs. Add extra cushions to increase the height of your chairs or couch.  Do not pivot. Take short steps when you turn. Contact your caregiver for follow-up as directed and if you have any other questions or concerns. SEEK IMMEDIATE MEDICAL CARE IF:  Your hip dislocates again.  You develop severe pain in your hip, unrelieved by medicine.  You have numbness, tingling, temperature changes, or discoloration of your leg or foot.  You have difficulty breathing.  You develop any signs of allergy to medicine such as hives, skin rash, itching, nausea, and vomiting. Document Released: 03/11/2004 Document Revised: 04/26/2011 Document Reviewed: 03/08/2008 [facility] Patient Information 2013 [place], [place].

## 2012-08-03 ENCOUNTER — Encounter (HOSPITAL_COMMUNITY): Payer: Self-pay | Admitting: Pharmacy Technician

## 2012-08-14 ENCOUNTER — Encounter (HOSPITAL_COMMUNITY): Payer: Self-pay

## 2012-08-14 ENCOUNTER — Encounter (HOSPITAL_COMMUNITY)
Admission: RE | Admit: 2012-08-14 | Discharge: 2012-08-14 | Disposition: A | Discharge status: Home / Self Care | Payer: Medicare Other | Source: Ambulatory Visit | Attending: Orthopedic Surgery | Admitting: Orthopedic Surgery

## 2012-08-14 HISTORY — DX: Secondary and unspecified malignant neoplasm of intra-abdominal lymph nodes: C77.2

## 2012-08-14 HISTORY — DX: Malignant neoplasm of unspecified site of unspecified female breast: C50.919

## 2012-08-14 HISTORY — DX: Age-related osteoporosis without current pathological fracture: M81.0

## 2012-08-14 HISTORY — DX: Malignant neoplasm of colon, unspecified: C18.9

## 2012-08-14 HISTORY — DX: Unspecified osteoarthritis, unspecified site: M19.90

## 2012-08-14 HISTORY — DX: Anxiety disorder, unspecified: F41.9

## 2012-08-14 LAB — CBC
Hemoglobin: 15.1 g/dL — ABNORMAL HIGH (ref 12.0–15.0)
MCH: 30.8 pg (ref 26.0–34.0)
MCHC: 34.3 g/dL (ref 30.0–36.0)
Platelets: 240 10*3/uL (ref 150–400)
RBC: 4.9 MIL/uL (ref 3.87–5.11)

## 2012-08-14 LAB — BASIC METABOLIC PANEL
Calcium: 10.4 mg/dL (ref 8.4–10.5)
GFR calc Af Amer: 78 mL/min — ABNORMAL LOW (ref >90)
GFR calc non Af Amer: 67 mL/min — ABNORMAL LOW (ref >90)
Glucose, Bld: 95 mg/dL (ref 70–99)
Potassium: 3.7 mEq/L (ref 3.5–5.1)
Sodium: 140 mEq/L (ref 135–145)

## 2012-08-14 LAB — PROTIME-INR
INR: 0.89 (ref 0.00–1.49)
Prothrombin Time: 11.9 seconds (ref 11.6–15.2)

## 2012-08-14 LAB — SURGICAL PCR SCREEN
MRSA, PCR: NEGATIVE
Staphylococcus aureus: NEGATIVE

## 2012-08-14 LAB — URINALYSIS, ROUTINE W REFLEX MICROSCOPIC
Bilirubin Urine: NEGATIVE
Hgb urine dipstick: NEGATIVE
Nitrite: NEGATIVE
Protein, ur: NEGATIVE mg/dL
Urobilinogen, UA: 0.2 mg/dL (ref 0.0–1.0)

## 2012-08-14 LAB — APTT: aPTT: 30 seconds (ref 24–37)

## 2012-08-14 NOTE — Patient Instructions (Addendum)
18 [name]  08/14/2012   Your procedure is scheduled on: 08/21/12  Report to [facility] at 5:15 AM.  Call this number if you have problems the morning of surgery [phone]: [phone]   Remember:   Do not eat food or drink liquids After Midnight.     Take these medicines the morning of surgery with A SIP OF WATER: prilosec, levothyroxine   Do not wear jewelry, make-up or nail polish.  Do not wear lotions, powders, or perfumes. You may wear deodorant.  Do not shave 48 hours prior to surgery. Men may shave face and neck.  Do not bring valuables to the hospital.  Contacts, dentures or bridgework may not be worn into surgery.  Leave suitcase in the car. After surgery it may be brought to your room.  For patients admitted to the hospital, checkout time is 11:00 AM the day of discharge.    Please read over the following fact sheets that you were given: MRSA Information, blood fact sheet, incentive spirometry fact sheet. Birdie Sons, RN  pre op nurse call if needed [phone]    FAILURE TO FOLLOW THESE INSTRUCTIONS MAY RESULT IN CANCELLATION OF YOUR SURGERY   Patient Signature: _________________________________________

## 2012-08-14 NOTE — Progress Notes (Signed)
Surgery clearance note 07/20/12 Dr. Kathrynn Running on chart, LOV note 07/29/12 on chart,  Urine culture 07/29/12 on chart, chest x-ray 07/20/12 on chart, EKG 07/20/12 on chart.

## 2012-08-20 ENCOUNTER — Encounter (HOSPITAL_COMMUNITY): Payer: Self-pay | Admitting: Anesthesiology

## 2012-08-20 NOTE — Anesthesia Preprocedure Evaluation (Addendum)
Anesthesia Evaluation  Patient identified by MRN, date of birth, ID band Patient awake  General Assessment Comment:.  Hypertension     .  Depression         no problems at this time   .  GERD (gastroesophageal reflux disease)         no problems at this time.   Marland Kitchen  Anxiety     .  Arthritis     .  Osteoporosis     .  Skin cancer  1987       Left leg   .  colon cancer  1987   .  Breast cancer  2005       left     Reviewed: Allergy & Precautions, H&P , NPO status , Patient's Chart, lab work & pertinent test results  Airway Mallampati: II TM Distance: >3 FB Neck ROM: Full    Dental no notable dental hx.    Pulmonary neg pulmonary ROS,  breath sounds clear to auscultation  Pulmonary exam normal       Cardiovascular Exercise Tolerance: Good hypertension, Pt. on medications negative cardio ROS  Rhythm:Regular Rate:Normal  No cardiopulmonary symptoms.  Clearance Dr. Kathrynn Running.   Neuro/Psych PSYCHIATRIC DISORDERS Anxiety Depression negative neurological ROS     GI/Hepatic Neg liver ROS, GERD-  Medicated,  Endo/Other  negative endocrine ROS  Renal/GU negative Renal ROS  negative genitourinary   Musculoskeletal negative musculoskeletal ROS (+)   Abdominal (+) + obese,   Peds negative pediatric ROS (+)  Hematology   Anesthesia Other Findings   Reproductive/Obstetrics negative OB ROS                          Anesthesia Physical Anesthesia Plan  ASA: II  Anesthesia Plan: General   Post-op Pain Management:    Induction: Intravenous  Airway Management Planned: Oral ETT  Additional Equipment:   Intra-op Plan:   Post-operative Plan: Extubation in OR  Informed Consent: I have reviewed the patients History and Physical, chart, labs and discussed the procedure including the risks, benefits and alternatives for the proposed anesthesia with the patient or authorized representative who has  indicated his/her understanding and acceptance.   Dental advisory given  Plan Discussed with: CRNA  Anesthesia Plan Comments: (Discussed general versus spinal. Patient prefers general.)       Anesthesia Quick Evaluation

## 2012-08-20 NOTE — H&P (Signed)
TOTAL HIP REVISION ADMISSION H&P  Patient is admitted for right revision total hip arthroplasty, possible constrained liner.  Subjective:  Chief Complaint: Right hip pain s/p right total hip arthroplasty  HPI: [name], 72 y.o. female, has a history of pain and functional disability in the right hip due to multiple dislocations. The indications for the revision total hip arthroplasty are multiple dislocations, the first being in August of 2013, then September of 2013 and then again in April of 2014. Onset of symptoms was abrupt starting 1 years ago with rapidlly worsening course since that time.  Prior procedures on the right hip include arthroplasty,in August of 2011 by Dr. Darrelyn Hillock.  Patient currently rates pain in the right hip as almost nothing but will go to a 10 out of 10 with a dislocation.  There is pain that interfers with activities of daily living. Patient has evidence of previous total hip arthroplasty by imaging studies.  This condition presents safety issues increasing the risk of falls. There is no current active signs of infection.  Risks, benefits and expectations were discussed with the patient. Patient understand the risks, benefits and expectations and wishes to proceed with surgery.   D/C Plans:   Home with HHPT  Post-op Meds:    Rx given for Xarelto, Zanaflex, Iron, Colace and MiraLax  Tranexamic Acid:   To be given  Decadron:    To be given  FYI:    Xarelto for 2 weeks, ASA allergy  Past Medical History  Diagnosis Date  . Hypertension   . Depression     no problems at this time  . GERD (gastroesophageal reflux disease)     no problems at this time.  Marland Kitchen Anxiety   . Arthritis   . Osteoporosis   . Skin cancer 1987    Left leg  . colon cancer 1987  . Breast cancer 2005    left    Past Surgical History  Procedure Laterality Date  . Colon surgery    . Thyroid surgery    . Skin cancer excision    . Breast surgery      left extremity restriction  .  Mastectomy    . Cholecystectomy  70's  . Joint replacement Right 2011    hip  . Hand surgery Bilateral   . Multiple tooth extractions       Allergies  Allergen Reactions  . Fentanyl Nausea And Vomiting  . Percocet (Oxycodone-Acetaminophen) Nausea And Vomiting  . Aspirin Rash and Other (See Comments)    Stomach cramps    History  Substance Use Topics  . Smoking status: Former Smoker -- 1.00 packs/day for 50 years    Types: Cigarettes    Quit date: 08/23/2004  . Smokeless tobacco: Never Used  . Alcohol Use: No    No family history on file.    Review of Systems  Constitutional: Negative.   HENT: Negative.   Eyes: Negative.   Respiratory: Negative.   Cardiovascular: Negative.   Gastrointestinal: Positive for heartburn and constipation.  Genitourinary: Negative.   Musculoskeletal: Positive for back pain and joint pain.  Skin: Negative.   Neurological: Negative.   Endo/Heme/Allergies: Negative.   Psychiatric/Behavioral: The patient is nervous/anxious and has insomnia.     Objective:  Physical Exam  Constitutional: She is oriented to person, place, and time. She appears well-developed and well-nourished.  HENT:  Head: Normocephalic and atraumatic.  Mouth/Throat: Oropharynx is clear and moist. She has dentures.  Eyes: Pupils are equal, round,  and reactive to light.  Neck: Neck supple. No JVD present. No tracheal deviation present. No thyromegaly present.  Cardiovascular: Normal rate, regular rhythm, normal heart sounds and intact distal pulses.   Respiratory: Effort normal and breath sounds normal. No stridor. No respiratory distress. She has no wheezes.  GI: Soft. There is no tenderness. There is no guarding.  Musculoskeletal:       Right hip: She exhibits decreased range of motion, decreased strength, tenderness, bony tenderness and laceration (healed from previous surgery). She exhibits no swelling, no crepitus and no deformity.  Lymphadenopathy:    She has no  cervical adenopathy.  Neurological: She is alert and oriented to person, place, and time.  Skin: Skin is warm and dry.  Psychiatric: She has a normal mood and affect.    Labs:  Estimated body mass index is 30.23 kg/(m^2) as calculated from the following:   Height as of 08/24/10: 5' 7.5" (1.715 m).   Weight as of 10/08/11: 88.905 kg (196 lb).  Imaging Review:  Plain radiographs demonstrate previous right hip total hip arthroplasty.The bone quality appears to be good for age and reported activity level.   Assessment/Plan:  End stage arthritis, right hip(s) with failed previous arthroplasty.  The patient history, physical examination, clinical judgement of the provider and imaging studies are consistent with end stage degenerative joint disease of the right hip(s), previous total hip arthroplasty. Revision total hip arthroplasty is deemed medically necessary. The treatment options including medical management, injection therapy, arthroscopy and arthroplasty were discussed at length. The risks and benefits of total hip arthroplasty were presented and reviewed. The risks due to aseptic loosening, infection, stiffness, dislocation/subluxation,  thromboembolic complications and other imponderables were discussed.  The patient acknowledged the explanation, agreed to proceed with the plan and consent was signed. Patient is being admitted for inpatient treatment for surgery, pain control, PT, OT, prophylactic antibiotics, VTE prophylaxis, progressive ambulation and ADL's and discharge planning. The patient is planning to be discharged home with home health services.    Anastasio Auerbach Yordi Krager   PAC  08/20/2012, 9:43 AM

## 2012-08-21 ENCOUNTER — Encounter (HOSPITAL_COMMUNITY): Payer: Self-pay | Admitting: *Deleted

## 2012-08-21 ENCOUNTER — Inpatient Hospital Stay (HOSPITAL_COMMUNITY)
Admission: RE | Admit: 2012-08-21 | Discharge: 2012-08-23 | DRG: 468 | Disposition: A | Discharge status: Home Health | Payer: Medicare Other | Source: Ambulatory Visit | Attending: Orthopedic Surgery | Admitting: Orthopedic Surgery

## 2012-08-21 ENCOUNTER — Inpatient Hospital Stay (HOSPITAL_COMMUNITY): Payer: Medicare Other | Admitting: Anesthesiology

## 2012-08-21 ENCOUNTER — Encounter (HOSPITAL_COMMUNITY)
Admission: RE | Disposition: A | Discharge status: Home Health | Payer: Self-pay | Source: Ambulatory Visit | Attending: Orthopedic Surgery

## 2012-08-21 ENCOUNTER — Encounter (HOSPITAL_COMMUNITY): Payer: Self-pay | Admitting: Anesthesiology

## 2012-08-21 ENCOUNTER — Inpatient Hospital Stay (HOSPITAL_COMMUNITY): Payer: Medicare Other

## 2012-08-21 DIAGNOSIS — M810 Age-related osteoporosis without current pathological fracture: Secondary | ICD-10-CM | POA: Diagnosis present

## 2012-08-21 DIAGNOSIS — Z6830 Body mass index (BMI) 30.0-30.9, adult: Secondary | ICD-10-CM

## 2012-08-21 DIAGNOSIS — Z01812 Encounter for preprocedural laboratory examination: Secondary | ICD-10-CM

## 2012-08-21 DIAGNOSIS — Y831 Surgical operation with implant of artificial internal device as the cause of abnormal reaction of the patient, or of later complication, without mention of misadventure at the time of the procedure: Secondary | ICD-10-CM | POA: Diagnosis present

## 2012-08-21 DIAGNOSIS — Z96649 Presence of unspecified artificial hip joint: Secondary | ICD-10-CM

## 2012-08-21 DIAGNOSIS — E669 Obesity, unspecified: Secondary | ICD-10-CM | POA: Diagnosis present

## 2012-08-21 DIAGNOSIS — M25559 Pain in unspecified hip: Secondary | ICD-10-CM | POA: Diagnosis present

## 2012-08-21 DIAGNOSIS — F329 Major depressive disorder, single episode, unspecified: Secondary | ICD-10-CM | POA: Diagnosis present

## 2012-08-21 DIAGNOSIS — Z87891 Personal history of nicotine dependence: Secondary | ICD-10-CM

## 2012-08-21 DIAGNOSIS — M169 Osteoarthritis of hip, unspecified: Secondary | ICD-10-CM | POA: Diagnosis present

## 2012-08-21 DIAGNOSIS — I10 Essential (primary) hypertension: Secondary | ICD-10-CM | POA: Diagnosis present

## 2012-08-21 DIAGNOSIS — T84099A Other mechanical complication of unspecified internal joint prosthesis, initial encounter: Principal | ICD-10-CM | POA: Diagnosis present

## 2012-08-21 DIAGNOSIS — F3289 Other specified depressive episodes: Secondary | ICD-10-CM | POA: Diagnosis present

## 2012-08-21 DIAGNOSIS — M1610 Unilateral primary osteoarthritis, unspecified hip: Secondary | ICD-10-CM | POA: Diagnosis present

## 2012-08-21 DIAGNOSIS — F411 Generalized anxiety disorder: Secondary | ICD-10-CM | POA: Diagnosis present

## 2012-08-21 DIAGNOSIS — K219 Gastro-esophageal reflux disease without esophagitis: Secondary | ICD-10-CM | POA: Diagnosis present

## 2012-08-21 HISTORY — PX: TOTAL HIP REVISION: SHX763

## 2012-08-21 SURGERY — TOTAL HIP REVISION
Anesthesia: General | Site: Hip | Laterality: Right | Wound class: Clean

## 2012-08-21 MED ORDER — DIPHENHYDRAMINE HCL 25 MG PO CAPS: 25.0000 mg | ORAL_CAPSULE | Freq: Every evening | ORAL | Status: DC | PRN

## 2012-08-21 MED ORDER — FERROUS SULFATE 325 (65 FE) MG PO TABS
325.0000 mg | ORAL_TABLET | Freq: Three times a day (TID) | ORAL | Status: DC
  Administered 2012-08-21 – 2012-08-23 (×5): 325 mg via ORAL
  Filled 2012-08-21 (×9): qty 1

## 2012-08-21 MED ORDER — LEVOTHYROXINE SODIUM 112 MCG PO TABS
112.0000 ug | ORAL_TABLET | Freq: Every day | ORAL | Status: DC
  Administered 2012-08-22 – 2012-08-23 (×2): 112 ug via ORAL
  Filled 2012-08-21 (×3): qty 1

## 2012-08-21 MED ORDER — FLEET ENEMA 7-19 GM/118ML RE ENEM: 1.0000 | ENEMA | Freq: Once | RECTAL | Status: AC | PRN

## 2012-08-21 MED ORDER — HYDROMORPHONE HCL PF 1 MG/ML IJ SOLN
INTRAMUSCULAR | Status: AC
  Filled 2012-08-21: qty 1

## 2012-08-21 MED ORDER — GLYCOPYRROLATE 0.2 MG/ML IJ SOLN
INTRAMUSCULAR | Status: DC | PRN
  Administered 2012-08-21: .6 mg via INTRAVENOUS

## 2012-08-21 MED ORDER — ZOLPIDEM TARTRATE 5 MG PO TABS: 5.0000 mg | ORAL_TABLET | Freq: Every evening | ORAL | Status: DC | PRN

## 2012-08-21 MED ORDER — ALUM & MAG HYDROXIDE-SIMETH 200-200-20 MG/5ML PO SUSP: 30.0000 mL | ORAL | Status: DC | PRN

## 2012-08-21 MED ORDER — PANTOPRAZOLE SODIUM 40 MG PO TBEC
80.0000 mg | DELAYED_RELEASE_TABLET | Freq: Every day | ORAL | Status: DC
  Administered 2012-08-21 – 2012-08-23 (×3): 80 mg via ORAL
  Filled 2012-08-21 (×3): qty 2

## 2012-08-21 MED ORDER — DEXAMETHASONE SODIUM PHOSPHATE 10 MG/ML IJ SOLN
10.0000 mg | Freq: Once | INTRAMUSCULAR | Status: AC
  Administered 2012-08-21: 10 mg via INTRAVENOUS

## 2012-08-21 MED ORDER — CEFAZOLIN SODIUM-DEXTROSE 2-3 GM-% IV SOLR
2.0000 g | INTRAVENOUS | Status: AC
  Administered 2012-08-21: 2 g via INTRAVENOUS

## 2012-08-21 MED ORDER — PROPOFOL 10 MG/ML IV BOLUS
INTRAVENOUS | Status: DC | PRN
  Administered 2012-08-21: 150 mg via INTRAVENOUS

## 2012-08-21 MED ORDER — METOCLOPRAMIDE HCL 10 MG PO TABS: 5.0000 mg | ORAL_TABLET | Freq: Three times a day (TID) | ORAL | Status: DC | PRN

## 2012-08-21 MED ORDER — DULOXETINE HCL 60 MG PO CPEP
60.0000 mg | ORAL_CAPSULE | Freq: Every evening | ORAL | Status: DC
  Administered 2012-08-21 – 2012-08-22 (×2): 60 mg via ORAL
  Filled 2012-08-21 (×3): qty 1

## 2012-08-21 MED ORDER — 0.9 % SODIUM CHLORIDE (POUR BTL) OPTIME
TOPICAL | Status: DC | PRN
  Administered 2012-08-21: 1000 mL

## 2012-08-21 MED ORDER — SODIUM CHLORIDE 0.9 % IV SOLN
100.0000 mL/h | INTRAVENOUS | Status: DC
  Administered 2012-08-21: 100 mL/h via INTRAVENOUS
  Filled 2012-08-21 (×12): qty 1000

## 2012-08-21 MED ORDER — METHOCARBAMOL 500 MG PO TABS
500.0000 mg | ORAL_TABLET | Freq: Four times a day (QID) | ORAL | Status: DC | PRN
  Administered 2012-08-22 – 2012-08-23 (×3): 500 mg via ORAL
  Filled 2012-08-21 (×3): qty 1

## 2012-08-21 MED ORDER — BISACODYL 10 MG RE SUPP: 10.0000 mg | Freq: Every day | RECTAL | Status: DC | PRN

## 2012-08-21 MED ORDER — PHENOL 1.4 % MT LIQD: 1.0000 | OROMUCOSAL | Status: DC | PRN

## 2012-08-21 MED ORDER — NEOSTIGMINE METHYLSULFATE 1 MG/ML IJ SOLN
INTRAMUSCULAR | Status: DC | PRN
  Administered 2012-08-21: 4 mg via INTRAVENOUS

## 2012-08-21 MED ORDER — METOCLOPRAMIDE HCL 5 MG/ML IJ SOLN: 5.0000 mg | Freq: Three times a day (TID) | INTRAMUSCULAR | Status: DC | PRN

## 2012-08-21 MED ORDER — ONDANSETRON HCL 4 MG PO TABS: 4.0000 mg | ORAL_TABLET | Freq: Four times a day (QID) | ORAL | Status: DC | PRN

## 2012-08-21 MED ORDER — TRANEXAMIC ACID 100 MG/ML IV SOLN
1000.0000 mg | Freq: Once | INTRAVENOUS | Status: AC
  Administered 2012-08-21: 1000 mg via INTRAVENOUS
  Filled 2012-08-21: qty 10

## 2012-08-21 MED ORDER — HYDROMORPHONE HCL PF 1 MG/ML IJ SOLN
0.2500 mg | INTRAMUSCULAR | Status: DC | PRN
  Administered 2012-08-21 (×2): 0.5 mg via INTRAVENOUS

## 2012-08-21 MED ORDER — CALCIUM CARBONATE ANTACID 500 MG PO CHEW
2.0000 | CHEWABLE_TABLET | ORAL | Status: DC | PRN
Start: 2012-08-21 — End: 2012-08-23
  Administered 2012-08-22: 400 mg via ORAL
  Filled 2012-08-21: qty 2

## 2012-08-21 MED ORDER — POLYETHYLENE GLYCOL 3350 17 G PO PACK
17.0000 g | PACK | Freq: Two times a day (BID) | ORAL | Status: DC
  Administered 2012-08-21 – 2012-08-23 (×3): 17 g via ORAL

## 2012-08-21 MED ORDER — LACTATED RINGERS IV SOLN
INTRAVENOUS | Status: DC | PRN
  Administered 2012-08-21 (×2): via INTRAVENOUS

## 2012-08-21 MED ORDER — DEXAMETHASONE SODIUM PHOSPHATE 10 MG/ML IJ SOLN
10.0000 mg | Freq: Once | INTRAMUSCULAR | Status: AC
  Administered 2012-08-22: 10 mg via INTRAVENOUS
  Filled 2012-08-21: qty 1

## 2012-08-21 MED ORDER — PHENYLEPHRINE HCL 10 MG/ML IJ SOLN
INTRAMUSCULAR | Status: DC | PRN
  Administered 2012-08-21: 40 ug via INTRAVENOUS

## 2012-08-21 MED ORDER — TRIAMTERENE-HCTZ 37.5-25 MG PO TABS
1.0000 | ORAL_TABLET | Freq: Every morning | ORAL | Status: DC
  Administered 2012-08-21 – 2012-08-23 (×3): 1 via ORAL
  Filled 2012-08-21 (×3): qty 1

## 2012-08-21 MED ORDER — HYDROCODONE-ACETAMINOPHEN 7.5-325 MG PO TABS
1.0000 | ORAL_TABLET | ORAL | Status: DC
  Administered 2012-08-21 – 2012-08-23 (×11): 2 via ORAL
  Filled 2012-08-21 (×11): qty 2

## 2012-08-21 MED ORDER — FENTANYL CITRATE 0.05 MG/ML IJ SOLN
INTRAMUSCULAR | Status: DC | PRN
  Administered 2012-08-21: 25 ug via INTRAVENOUS
  Administered 2012-08-21: 50 ug via INTRAVENOUS
  Administered 2012-08-21: 25 ug via INTRAVENOUS
  Administered 2012-08-21: 50 ug via INTRAVENOUS

## 2012-08-21 MED ORDER — ONDANSETRON HCL 4 MG/2ML IJ SOLN: 4.0000 mg | Freq: Four times a day (QID) | INTRAMUSCULAR | Status: DC | PRN

## 2012-08-21 MED ORDER — PROMETHAZINE HCL 25 MG/ML IJ SOLN: 6.2500 mg | INTRAMUSCULAR | Status: DC | PRN

## 2012-08-21 MED ORDER — MENTHOL 3 MG MT LOZG: 1.0000 | LOZENGE | OROMUCOSAL | Status: DC | PRN

## 2012-08-21 MED ORDER — DOCUSATE SODIUM 100 MG PO CAPS
100.0000 mg | ORAL_CAPSULE | Freq: Two times a day (BID) | ORAL | Status: DC
  Administered 2012-08-21 – 2012-08-23 (×4): 100 mg via ORAL

## 2012-08-21 MED ORDER — SUCCINYLCHOLINE CHLORIDE 20 MG/ML IJ SOLN
INTRAMUSCULAR | Status: DC | PRN
  Administered 2012-08-21: 100 mg via INTRAVENOUS

## 2012-08-21 MED ORDER — LACTATED RINGERS IV SOLN: INTRAVENOUS | Status: DC

## 2012-08-21 MED ORDER — CISATRACURIUM BESYLATE (PF) 10 MG/5ML IV SOLN
INTRAVENOUS | Status: DC | PRN
  Administered 2012-08-21: 4 mg via INTRAVENOUS

## 2012-08-21 MED ORDER — HYDROMORPHONE HCL PF 1 MG/ML IJ SOLN
0.5000 mg | INTRAMUSCULAR | Status: DC | PRN
  Administered 2012-08-21: 0.5 mg via INTRAVENOUS
  Filled 2012-08-21: qty 1

## 2012-08-21 MED ORDER — DEXTROSE 5 % IV SOLN
500.0000 mg | Freq: Four times a day (QID) | INTRAVENOUS | Status: DC | PRN
  Filled 2012-08-21: qty 5

## 2012-08-21 MED ORDER — CEFAZOLIN SODIUM-DEXTROSE 2-3 GM-% IV SOLR
2.0000 g | Freq: Four times a day (QID) | INTRAVENOUS | Status: AC
  Administered 2012-08-21 (×2): 2 g via INTRAVENOUS
  Filled 2012-08-21 (×2): qty 50

## 2012-08-21 MED ORDER — ONDANSETRON HCL 4 MG/2ML IJ SOLN
INTRAMUSCULAR | Status: DC | PRN
  Administered 2012-08-21: 4 mg via INTRAVENOUS

## 2012-08-21 MED ORDER — POTASSIUM CHLORIDE ER 10 MEQ PO TBCR
10.0000 meq | EXTENDED_RELEASE_TABLET | Freq: Every morning | ORAL | Status: DC
  Administered 2012-08-21 – 2012-08-23 (×3): 10 meq via ORAL
  Filled 2012-08-21 (×3): qty 1

## 2012-08-21 MED ORDER — CLONAZEPAM 1 MG PO TABS
1.0000 mg | ORAL_TABLET | Freq: Every day | ORAL | Status: DC
  Administered 2012-08-21 – 2012-08-22 (×2): 1 mg via ORAL
  Filled 2012-08-21 (×2): qty 1

## 2012-08-21 MED ORDER — RIVAROXABAN 10 MG PO TABS
10.0000 mg | ORAL_TABLET | ORAL | Status: DC
  Administered 2012-08-22 – 2012-08-23 (×2): 10 mg via ORAL
  Filled 2012-08-21 (×3): qty 1

## 2012-08-21 SURGICAL SUPPLY — 50 items
BAG ZIPLOCK 12X15 (MISCELLANEOUS) ×2 IMPLANT
BLADE SAW SGTL 18X1.27X75 (BLADE) ×2 IMPLANT
CLOTH BEACON ORANGE TIMEOUT ST (SAFETY) ×2 IMPLANT
DERMABOND ADVANCED (GAUZE/BANDAGES/DRESSINGS) ×1
DERMABOND ADVANCED .7 DNX12 (GAUZE/BANDAGES/DRESSINGS) ×1 IMPLANT
DRAPE INCISE IOBAN 85X60 (DRAPES) ×2 IMPLANT
DRAPE ORTHO SPLIT 77X108 STRL (DRAPES) ×2
DRAPE POUCH INSTRU U-SHP 10X18 (DRAPES) ×2 IMPLANT
DRAPE SURG 17X11 SM STRL (DRAPES) ×2 IMPLANT
DRAPE SURG ORHT 6 SPLT 77X108 (DRAPES) ×2 IMPLANT
DRAPE U-SHAPE 47X51 STRL (DRAPES) ×2 IMPLANT
DRSG AQUACEL AG ADV 3.5X10 (GAUZE/BANDAGES/DRESSINGS) ×2 IMPLANT
DRSG AQUACEL AG ADV 3.5X14 (GAUZE/BANDAGES/DRESSINGS) IMPLANT
DRSG TEGADERM 4X4.75 (GAUZE/BANDAGES/DRESSINGS) IMPLANT
DURAPREP 26ML APPLICATOR (WOUND CARE) ×2 IMPLANT
ELECT BLADE TIP CTD 4 INCH (ELECTRODE) ×2 IMPLANT
ELECT REM PT RETURN 9FT ADLT (ELECTROSURGICAL) ×2
ELECTRODE REM PT RTRN 9FT ADLT (ELECTROSURGICAL) ×1 IMPLANT
EVACUATOR 1/8 PVC DRAIN (DRAIN) IMPLANT
FACESHIELD LNG OPTICON STERILE (SAFETY) ×8 IMPLANT
GAUZE SPONGE 2X2 8PLY STRL LF (GAUZE/BANDAGES/DRESSINGS) IMPLANT
GLOVE BIOGEL PI IND STRL 7.5 (GLOVE) ×1 IMPLANT
GLOVE BIOGEL PI IND STRL 8 (GLOVE) ×1 IMPLANT
GLOVE BIOGEL PI INDICATOR 7.5 (GLOVE) ×1
GLOVE BIOGEL PI INDICATOR 8 (GLOVE) ×1
GLOVE ECLIPSE 8.0 STRL XLNG CF (GLOVE) ×4 IMPLANT
GLOVE ORTHO TXT STRL SZ7.5 (GLOVE) ×4 IMPLANT
GOWN BRE IMP PREV XXLGXLNG (GOWN DISPOSABLE) ×4 IMPLANT
GOWN STRL NON-REIN LRG LVL3 (GOWN DISPOSABLE) ×4 IMPLANT
HANDPIECE INTERPULSE COAX TIP (DISPOSABLE)
HEAD FEM BIOLOX DELTA 36 8.5 (Orthopedic Implant) ×2 IMPLANT
KIT BASIN OR (CUSTOM PROCEDURE TRAY) ×2 IMPLANT
LINER NEUTRAL 52X36X54 PLUS 4 (Liner) ×2 IMPLANT
MANIFOLD NEPTUNE II (INSTRUMENTS) ×2 IMPLANT
NS IRRIG 1000ML POUR BTL (IV SOLUTION) ×2 IMPLANT
PACK TOTAL JOINT (CUSTOM PROCEDURE TRAY) ×2 IMPLANT
POSITIONER SURGICAL ARM (MISCELLANEOUS) ×4 IMPLANT
SET HNDPC FAN SPRY TIP SCT (DISPOSABLE) IMPLANT
SPONGE GAUZE 2X2 STER 10/PKG (GAUZE/BANDAGES/DRESSINGS)
SPONGE LAP 18X18 X RAY DECT (DISPOSABLE) IMPLANT
STAPLER VISISTAT 35W (STAPLE) IMPLANT
SUCTION FRAZIER TIP 10 FR DISP (SUCTIONS) ×2 IMPLANT
SUT MNCRL AB 4-0 PS2 18 (SUTURE) ×2 IMPLANT
SUT VIC AB 1 CT1 36 (SUTURE) ×6 IMPLANT
SUT VIC AB 2-0 CT1 27 (SUTURE) ×3
SUT VIC AB 2-0 CT1 TAPERPNT 27 (SUTURE) ×3 IMPLANT
SUT VLOC 180 0 24IN GS25 (SUTURE) ×2 IMPLANT
TOWEL OR 17X26 10 PK STRL BLUE (TOWEL DISPOSABLE) ×2 IMPLANT
TRAY FOLEY CATH 14FRSI W/METER (CATHETERS) ×2 IMPLANT
WATER STERILE IRR 1500ML POUR (IV SOLUTION) ×4 IMPLANT

## 2012-08-21 NOTE — Op Note (Signed)
[name], [name]              ACCOUNT NO.:  [id number]  MEDICAL RECORD NO.:  [id number]  LOCATION:  1604                         FACILITY:  [facility]  PHYSICIAN:  Madlyn Frankel. Charlann Boxer, M.D.  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  PROCEDURE:  Revision of right total hip replacement.  COMPONENTS USED:  DePuy revision components using a size 54 x 36, +4 10- degree face changing liner, and a 36, +8.5 TS delta ceramic ball.  SURGEON:  Madlyn Frankel. Charlann Boxer, MD  ASSISTANT:  Lanney Gins, PA.  Note that Mr. [name] was present for the entirety of the case from preoperative position, perioperative management of operative extremity, general facilitation during the case, and primary wound closure.  ANESTHESIA:  General.  SPECIMENS:  None.  COMPLICATIONS:  None.  ESTIMATED BLOOD LOSS:  Less than 150 mL.  DRAINS:  None.  PERTINENT FINDINGS:  The patient was noted to have a 54-mm acetabular shell with a very thin posterior wall identified.  Her anteversion of the acetabular shell or forward flexion of the shell appeared to be adequate enough, but not too much.  My only finding of concern was perhaps of the femoral stem had only 10 degrees of anteversion.  There was also noted to be some loosening of the components with increased Shuck and evidence of subluxation with forward flexion, internal rotation, and in the sleep position.  Given these findings, the procedure was carried out as above.  INDICATION FOR PROCEDURE:  [name] is a 72 year old female, patient of Dr. Libby Maw.  She has  had a right total hip replacement performed in 2011.  She unfortunately had a fall last year and subsequently had a dislocation with two other dislocations.  She was seen and evaluated on referral from Dr. Darrelyn Hillock for this.  Given the instability, she had the concern for dislocation, and pain and discomfort associated with this. She wished to proceed with more definitive surgery with post observation, considering that this has failed.  Risks of recurrent dislocation, infection, need for future revision surgery were discussed for the benefit of a stable and non-painful hip.  PROCEDURE IN DETAIL:  The patient was brought to operative theater. Once adequate anesthesia and preoperative antibiotics, Ancef administered as well as tranexamic acid, she was positioned into the left lateral decubitus position with the right side up.  The right lower extremity was then prepped and draped in sterile fashion.  A time-out was performed, identifying the patient, planned procedure, and extremity.  Note that we had all viable options from constrained liners to acetabular revision components available for this procedure.  The patient's previous incision was noted to be fairly anterior on the hip in relationship to revision surgery.  I chose at this point to make a separate incision for posterior approach.  Sharp dissection was carried down to  the iliotibial band and gluteal fascia.  This was then incised for posterior approach.  As we went through the gluteus maximus muscle, we identified a pseudo-capsular layer with no signs of infection, clear synovial fluid exposed joint posteriorly.  Once the hip was relatively exposed, I assessed range of motion, fine with hip flexion, internal rotation.  There was evidence of subluxation as well as in the sleep position, there was a few mm Shuck.  Given these findings, it gave me an indication of how to proceed.  I evaluated also the bone stock in the posterior  aspect of the shell.  I was worried that the based on the 54 cup, noted to be potential loss of bone with attempt at removing the cup.  The only thing that identified otherwise was that the femoral component appeared to be less anteverted and then hope for just 10 degrees.  At this point, I made a decision to make a trial reduction with a face changing liner and increased tension by the head size, thereby providing feel of the hip as opposed to get a constrained liner or removing components with considerable chance of morbidity with bone loss.  At this point, I removed the old acetabular liner and femoral head.  I removed the hole eliminator and placed a trial 54, 36, +4 10-degree face changing liner and trial with a 36, +8.5 ball.  With this, I found no evidence of any instability at all.  No impingement with external rotation, extension.  No evidence of any subluxation with forward flexion, internal rotation, or in the sleep position.  Given this, though we knew we were going to put some increased length on the system, based on the change from a 36+ 1.5 ball to an 8.5 ball, I felt due to the instability concerns that this is acceptable and that checking her leg lengths, this was not a significant lengthening based on the down leg.  Given all these findings and instability and her history and indications for this procedure, the trial components removed.  I replaced the hole eliminator and we irrigated the hip out, also made certain that the acetabular rim was free of any debris.  The final 54 x 36 +4, 10-degree face changing liner was positioned with the lip liner positioned at approximately 9 o'clock for this right hip.  I then impacted on top of the clean and dry trunnion 36+ 8.5 delta ceramic ball with a TS insert. The hip was then reduced.  We irrigated the hip throughout the case. Again, at this point, there was no significant hemostasis required.  I did place a #1 Vicryl into the  pseudo-capsule posteriorly to the superior leaflet.  I then reapproximated the iliotibial band and gluteal fascia using a combination of #1 Vicryl and 0 V-Loc.  The remainder of the wound was closed with 2-0 Vicryl and running 4-0 Monocryl.  The hip was cleaned, dried, dressed sterilely using Dermabond and Aquacel dressing.  The patient was then extubated and brought to the recovery room in stable condition.  She will be weightbearing as tolerated.  We will place on posterior hip precautions based on her history and follow up in office in 2 weeks for wound check.     Madlyn Frankel Charlann Boxer, M.D.     MDO/MEDQ  D:  08/21/2012  T:  08/21/2012  Job:  [id number]

## 2012-08-21 NOTE — Brief Op Note (Signed)
08/21/2012  8:35 AM  PATIENT:  [name]  72 y.o. female  PRE-OPERATIVE DIAGNOSIS:  RIGHT TOTAL HIP INSTABILITY, multiple dislocations  POST-OPERATIVE DIAGNOSIS:  RIGHT TOTAL HIP INSTABILITY, multiple dislocations  PROCEDURE:  Procedure(s): RIGHT TOTAL HIP REVISION LINER AND HEAD BALL (Right)  SURGEON:  Surgeon(s) and Role:    * [name], MD - Primary  PHYSICIAN ASSISTANT: Lanney Gins, PA-C  ANESTHESIA:   general  EBL:  Total I/O In: 1000 [I.V.:1000] Out: 250 [Urine:100; Blood:150]  BLOOD ADMINISTERED:none  DRAINS: none   LOCAL MEDICATIONS USED:  NONE  SPECIMEN:  No Specimen  DISPOSITION OF SPECIMEN:  N/A  COUNTS:  YES  TOURNIQUET:  * No tourniquets in log *  DICTATION: .Other Dictation: Dictation Number [id number]  PLAN OF CARE: Admit to inpatient   PATIENT DISPOSITION:  PACU - hemodynamically stable.   Delay start of Pharmacological VTE agent (>24hrs) due to surgical blood loss or risk of bleeding: no

## 2012-08-21 NOTE — Anesthesia Postprocedure Evaluation (Signed)
  Anesthesia Post-op Note  Patient: [name]  Procedure(s) Performed: Procedure(s) (LRB): RIGHT TOTAL HIP REVISION LINER AND HEAD BALL (Right)  Patient Location: PACU  Anesthesia Type: General  Level of Consciousness: awake and alert   Airway and Oxygen Therapy: Patient Spontanous Breathing  Post-op Pain: mild  Post-op Assessment: Post-op Vital signs reviewed, Patient's Cardiovascular Status Stable, Respiratory Function Stable, Patent Airway and No signs of Nausea or vomiting  Last Vitals:  Filed Vitals:   08/21/12 1015  BP:   Pulse: 94  Temp:   Resp: 15    Post-op Vital Signs: stable   Complications: No apparent anesthesia complications. Required some intubated ventilation in the PACU because she had minimal breathing even with minimal intraoperative narcotics. Has done fine since extubation.

## 2012-08-21 NOTE — Preoperative (Signed)
Beta Blockers   Reason not to administer Beta Blockers:Not Applicable 

## 2012-08-21 NOTE — Progress Notes (Signed)
Physical Therapy Treatment Patient Details Name: [name] MRN: [id number] DOB: 29-Mar-1940 Today's Date: 08/21/2012 Time: 4098-1191 PT Time Calculation (min): 10 min  PT Assessment / Plan / Recommendation  PT Comments   **Assisted pt back to bed with min A. SaO2 78% on RA with activity, 97%on 2L O2 [place]. RN aware. Encouraged incentive spirometer. *  Follow Up Recommendations  Home health PT     Does the patient have the potential to tolerate intense rehabilitation     Barriers to Discharge        Equipment Recommendations  None recommended by PT    Recommendations for Other Services    Frequency 7X/week   Progress towards PT Goals Progress towards PT goals: Progressing toward goals  Plan      Precautions / Restrictions Precautions Precautions: Posterior Hip Precaution Booklet Issued: Yes (comment) Precaution Comments: sign hung in room, reviewed precautions with pt/family Restrictions RLE Weight Bearing: Weight bearing as tolerated   Pertinent Vitals/Pain **7/10 with activity Ice applied*    Mobility  Bed Mobility Bed Mobility: Sit to Supine Supine to Sit: 4: Min assist Sit to Supine: 4: Min assist Details for Bed Mobility Assistance: min A to support RLE Transfers Transfers: Sit to Stand;Stand to Sit Sit to Stand: 4: Min assist;From chair/3-in-1 Stand to Sit: 4: Min assist;To bed Details for Transfer Assistance: VCs hand placement Ambulation/Gait Ambulation/Gait Assistance: 4: Min Environmental consultant (Feet): 5 Feet Assistive device: Rolling walker Ambulation/Gait Assistance Details: distance limited by fatigue, SaO2 78% on RA with walking, 97% on 2L [place] O2 Gait Pattern: Step-to pattern;Decreased step length - right;Decreased step length - left General Gait Details: VCs for sequencing    Exercises Total Joint Exercises Ankle Circles/Pumps: AROM;Both;10 reps;Supine Quad Sets: AROM;Both;5 reps;Supine Heel Slides: AAROM;Right;Supine;5 reps Hip  ABduction/ADduction: AAROM;Right;Supine;5 reps   PT Diagnosis: Difficulty walking;Acute pain  PT Problem List: Decreased strength;Pain;Decreased activity tolerance;Decreased mobility;Decreased range of motion PT Treatment Interventions: Gait training;Functional mobility training;Therapeutic exercise;Patient/family education;Therapeutic activities   PT Goals (current goals can now be found in the care plan section) Acute Rehab PT Goals Patient Stated Goal: to walk, be able to go home PT Goal Formulation: With patient/family Time For Goal Achievement: 08/28/12 Potential to Achieve Goals: Good  Visit Information  Last PT Received On: 08/21/12 Assistance Needed: +1 History of Present Illness: 72 y.o. female admitted for revision of R THA liner and head ball 2* multiple dislocations.     Subjective Data  Patient Stated Goal: to walk, be able to go home   Cognition  Cognition Arousal/Alertness: Awake/alert Behavior During Therapy: WFL for tasks assessed/performed Overall Cognitive Status: Within Functional Limits for tasks assessed    Balance     End of Session PT - End of Session Equipment Utilized During Treatment: Gait belt Activity Tolerance: Patient limited by fatigue Patient left: in chair;with call bell/phone within reach;with family/visitor present Nurse Communication: Mobility status   GP     [name] 08/21/2012, 3:20 PM [phone]

## 2012-08-21 NOTE — Transfer of Care (Signed)
Immediate Anesthesia Transfer of Care Note  Patient: [name]  Procedure(s) Performed: Procedure(s): RIGHT TOTAL HIP REVISION LINER AND HEAD BALL (Right)  Patient Location: PACU  Anesthesia Type:General  Level of Consciousness: awake and patient cooperative  Airway & Oxygen Therapy: Patient Spontanous Breathing and Patient connected to T-piece oxygen  Post-op Assessment: Report given to PACU RN and Post -op Vital signs reviewed and stable  Post vital signs: Reviewed and stable  Complications: No apparent anesthesia complications

## 2012-08-21 NOTE — Interval H&P Note (Signed)
History and Physical Interval Note:  08/21/2012 7:09 AM  [name]  has presented today for surgery, with the diagnosis of RIGHT TOTAL HIP INSTABILITY  The various methods of treatment have been discussed with the patient and family. After consideration of risks, benefits and other options for treatment, the patient has consented to  Procedure(s): RIGHT TOTAL HIP REVISION, POSSIBLE CONSTRAINED LINER (Right) as a surgical intervention .  The patient's history has been reviewed, patient examined, no change in status, stable for surgery.  I have reviewed the patient's chart and labs.  Questions were answered to the patient's satisfaction.     [name]

## 2012-08-21 NOTE — Progress Notes (Signed)
Patient extubated per MD's verbal order at patient bedside and placed on simple mask SATS 96%, RR 15 HR 92, BP 172/77, pt. Able to vocalize no stridor heard at neck.

## 2012-08-21 NOTE — Progress Notes (Signed)
Utilization review completed.  

## 2012-08-21 NOTE — Evaluation (Signed)
Physical Therapy Evaluation Patient Details Name: [name] MRN: [id number] DOB: 1941/01/18 Today's Date: 08/21/2012 Time: 1345-1410 PT Time Calculation (min): 25 min  PT Assessment / Plan / Recommendation History of Present Illness  72 y.o. female admitted for revision of R THA liner and head ball 2* multiple dislocations.   Clinical Impression  *POD #0. Pt ambulated 10' with RW, distance limited by dizziness. SaO2 82% on RA;  HR 115 with walking, SaO2 up to 94% on 2L O2. RN aware. REviewed posterior THA precautions and exercises. Good progress expected.**    PT Assessment  Patient needs continued PT services    Follow Up Recommendations  Home health PT    Does the patient have the potential to tolerate intense rehabilitation      Barriers to Discharge        Equipment Recommendations  None recommended by PT    Recommendations for Other Services     Frequency 7X/week    Precautions / Restrictions Precautions Precautions: Posterior Hip Precaution Booklet Issued: Yes (comment) Precaution Comments: sign hung in room, reviewed precautions with pt/family Restrictions RLE Weight Bearing: Weight bearing as tolerated   Pertinent Vitals/Pain *7/10 R hip with movement Premedicated, ice applied**      Mobility  Bed Mobility Bed Mobility: Supine to Sit Supine to Sit: 4: Min assist Details for Bed Mobility Assistance: min A to support RLE Transfers Transfers: Sit to Stand;Stand to Sit Sit to Stand: 4: Min assist;From bed Stand to Sit: To chair/3-in-1;4: Min assist Details for Transfer Assistance: VCs hand placement Ambulation/Gait Ambulation/Gait Assistance: 4: Min Environmental consultant (Feet): 10 Feet Assistive device: Rolling walker Ambulation/Gait Assistance Details: distance limited by dizziness, SaO2 82% on RA walking, 94% on 2L [place] O2 Gait Pattern: Step-to pattern;Decreased step length - right;Decreased step length - left General Gait Details: VCs for  sequencing    Exercises Total Joint Exercises Ankle Circles/Pumps: AROM;Both;10 reps;Supine Quad Sets: AROM;Both;5 reps;Supine Heel Slides: AAROM;Right;10 reps;Supine Hip ABduction/ADduction: AAROM;Right;10 reps;Supine   PT Diagnosis: Difficulty walking;Acute pain  PT Problem List: Decreased strength;Pain;Decreased activity tolerance;Decreased mobility;Decreased range of motion PT Treatment Interventions: Gait training;Functional mobility training;Therapeutic exercise;Patient/family education;Therapeutic activities     PT Goals(Current goals can be found in the care plan section) Acute Rehab PT Goals Patient Stated Goal: to walk, be able to go home PT Goal Formulation: With patient/family Time For Goal Achievement: 08/28/12 Potential to Achieve Goals: Good  Visit Information  Last PT Received On: 08/21/12 Assistance Needed: +1 History of Present Illness: 72 y.o. female admitted for revision of R THA liner and head ball 2* multiple dislocations.        Prior Functioning  Home Living Family/patient expects to be discharged to:: Private residence Living Arrangements: Children Available Help at Discharge: Family;Available 24 hours/day Type of Home: House Home Access: Level entry Home Layout: One level Home Equipment: Walker - 2 wheels;Bedside commode;Shower seat - built in;Cane - single point Prior Function Level of Independence: Needs assistance Gait / Transfers Assistance Needed: independent with walker or cane ADL's / Homemaking Assistance Needed: assist for bathing LEs, able to dress independently Comments: used walker Communication Communication: No difficulties    Cognition  Cognition Arousal/Alertness: Awake/alert Behavior During Therapy: WFL for tasks assessed/performed Overall Cognitive Status: Within Functional Limits for tasks assessed    Extremity/Trunk Assessment Lower Extremity Assessment Lower Extremity Assessment: RLE deficits/detail RLE Deficits /  Details: R hip ABDuction AAROM  limited to 15* by pain, knee ext 3/5 at least RLE: Unable to fully  assess due to pain Cervical / Trunk Assessment Cervical / Trunk Assessment: Normal   Balance    End of Session PT - End of Session Equipment Utilized During Treatment: Gait belt Activity Tolerance: Patient limited by fatigue Patient left: in chair;with call bell/phone within reach;with family/visitor present Nurse Communication: Mobility status  GP     [name] 08/21/2012, 2:44 PM [phone]

## 2012-08-22 ENCOUNTER — Encounter (HOSPITAL_COMMUNITY): Payer: Self-pay | Admitting: Orthopedic Surgery

## 2012-08-22 DIAGNOSIS — E669 Obesity, unspecified: Secondary | ICD-10-CM | POA: Diagnosis present

## 2012-08-22 LAB — CBC
Platelets: 224 10*3/uL (ref 150–400)
RBC: 4.23 MIL/uL (ref 3.87–5.11)
WBC: 16.7 10*3/uL — ABNORMAL HIGH (ref 4.0–10.5)

## 2012-08-22 LAB — BASIC METABOLIC PANEL
CO2: 28 mEq/L (ref 19–32)
Chloride: 103 mEq/L (ref 96–112)
Sodium: 137 mEq/L (ref 135–145)

## 2012-08-22 MED ORDER — TIZANIDINE HCL 4 MG PO CAPS: 4.0000 mg | ORAL_CAPSULE | Freq: Three times a day (TID) | ORAL | Status: DC | PRN

## 2012-08-22 MED ORDER — RIVAROXABAN 10 MG PO TABS: 10.0000 mg | ORAL_TABLET | ORAL | Status: DC

## 2012-08-22 MED ORDER — DSS 100 MG PO CAPS: 100.0000 mg | ORAL_CAPSULE | Freq: Two times a day (BID) | ORAL | Status: DC

## 2012-08-22 MED ORDER — FERROUS SULFATE 325 (65 FE) MG PO TABS: 325.0000 mg | ORAL_TABLET | Freq: Three times a day (TID) | ORAL | Status: DC

## 2012-08-22 MED ORDER — POLYETHYLENE GLYCOL 3350 17 G PO PACK: 17.0000 g | PACK | Freq: Two times a day (BID) | ORAL | Status: DC

## 2012-08-22 MED ORDER — HYDROCODONE-ACETAMINOPHEN 7.5-325 MG PO TABS: 1.0000 | ORAL_TABLET | ORAL | Status: DC | PRN

## 2012-08-22 NOTE — Care Management Note (Addendum)
    Page 1 of 1   08/24/2012     5:49:08 PM   CARE MANAGEMENT NOTE 08/24/2012  Patient:  [name], [name]   Account Number:  [id number]  Date Initiated:  08/22/2012  Documentation initiated by:  Colleen Can  Subjective/Objective Assessment:   dx rt total hip instability; revision of rt total hip arthroplasty.    Pre-arranged with [facility]  to provide HHpt services upon discharge.     Action/Plan:   Cm spoke with patient and daughter. Plans are for patient to return to her home where she lives with daughter. Daughter will be caregiver. Pt already has RW and 3n1.   Anticipated DC Date:  08/24/2012   Anticipated DC Plan:  HOME W HOME HEALTH SERVICES      DC Planning Services  CM consult      [facility] Choice  HOME HEALTH   Choice offered to / List presented to:  C-4 Adult Children        HH arranged  HH-2 PT      [facility] agency  [facility]   Status of service:  Completed, signed off Medicare Important Message given?  NA - LOS <3 / Initial given by admissions (If response is "NO", the following Medicare IM given date fields will be blank) Date Medicare IM given:   Date Additional Medicare IM given:    Discharge Disposition:  HOME W HOME HEALTH SERVICES  Per UR Regulation:    If discussed at Long Length of Stay Meetings, dates discussed:    Comments:

## 2012-08-22 NOTE — Progress Notes (Signed)
Physical Therapy Treatment Patient Details Name: IBTISAM BENGE MRN: [id number] DOB: 24-Mar-1940 Today's Date: 08/22/2012 Time: 4098-1191 PT Time Calculation (min): 25 min  PT Assessment / Plan / Recommendation  PT Comments   Pt is not as impulsive. Daughters present and appropriately assisting pt with RLE and cues for hip precautions  Follow Up Recommendations  Home health PT;Supervision/Assistance - 24 hour     Does the patient have the potential to tolerate intense rehabilitation     Barriers to Discharge        Equipment Recommendations  None recommended by PT    Recommendations for Other Services    Frequency 7X/week   Progress towards PT Goals Progress towards PT goals: Progressing toward goals  Plan Current plan remains appropriate    Precautions / Restrictions Precautions Precautions: Posterior Hip;Fall Precaution Booklet Issued: Yes (comment) Precaution Comments: impulsive. reviewed all hip precautions with pt and family. pt could state 2/3 on her own. Restrictions Weight Bearing Restrictions: No RLE Weight Bearing: Weight bearing as tolerated   Pertinent Vitals/Pain sats 89 RA HR 110 after amb x 100', replaced O2 2 l    Mobility  Bed Mobility Sit to Supine: 4: Min assist Details for Bed Mobility Assistance: daughter present to assist Transfers Sit to Stand: 4: Min assist;With upper extremity assist;From chair/3-in-1 Stand to Sit: 4: Min assist;With upper extremity assist;To bed Transfer via Lift Equipment: Maximove Details for Transfer Assistance: verbal cues for hand placement and  precautions Ambulation/Gait Ambulation/Gait Assistance: 4: Min Environmental consultant (Feet): 100 Feet Assistive device: Rolling walker Ambulation/Gait Assistance Details: pt continues to exhibit abnormal gait, pt states that she seems to have difficulty moving L leg. pt amb. x 15 ' with L shoe on and R off and appeared to have a slightly improved gait.  Gait Pattern:  Step-to pattern;Decreased step length - right;Decreased step length - left;Step-through pattern;Ataxic;Left circumduction General Gait Details: VCs for sequencing    Exercises     PT Diagnosis:    PT Problem List:   PT Treatment Interventions:     PT Goals (current goals can now be found in the care plan section) Acute Rehab PT Goals Patient Stated Goal: to walk, be able to go home  Visit Information  Last PT Received On: 08/22/12 Assistance Needed: +1 History of Present Illness: 72 y.o. female admitted for revision of R THA liner and head ball 2* multiple dislocations.     Subjective Data  Patient Stated Goal: to walk, be able to go home   Cognition  Cognition Arousal/Alertness: Awake/alert Behavior During Therapy: Impulsive Overall Cognitive Status: Within Functional Limits for tasks assessed    Balance  Balance Balance Assessed: Yes Static Standing Balance Static Standing - Balance Support: Left upper extremity supported Static Standing - Level of Assistance: 4: Min assist  End of Session PT - End of Session Activity Tolerance: Patient tolerated treatment well Patient left: with call bell/phone within reach;in bed Nurse Communication: Mobility status   GP     Rada Hay 08/22/2012, 3:00 PM

## 2012-08-22 NOTE — Evaluation (Addendum)
Occupational Therapy Evaluation Patient Details Name: [name] MRN: [id number] DOB: 1940/02/23 Today's Date: 08/22/2012 Time: 1000-1030 OT Time Calculation (min): 30 min  OT Assessment / Plan / Recommendation History of present illness 72 y.o. female admitted for revision of R THA liner and head ball 2* multiple dislocations.    Clinical Impression   Pt is currently limited by fatigue and HR up to 111 after activity and sats 94% on RA. Reapplied O2. Nursing aware. Pt needs more practice with 3in1 and AE to increase safety and independence. Daughter will be there with her at d/c.    OT Assessment  Patient needs continued OT Services    Follow Up Recommendations  Home health OT;Supervision/Assistance - 24 hour    Barriers to Discharge      Equipment Recommendations  None recommended by OT    Recommendations for Other Services    Frequency  Min 2X/week    Precautions / Restrictions Precautions Precautions: Posterior Hip;Fall Precaution Booklet Issued: Yes (comment) Precaution Comments: impulsive. reviewed all hip precautions with pt and family. pt could state 2/3 on her own. Restrictions Weight Bearing Restrictions: No RLE Weight Bearing: Weight bearing as tolerated   Pertinent Vitals/Pain 5/10; reposition, ice    ADL  Eating/Feeding: Simulated;Independent Where Assessed - Eating/Feeding: Chair Grooming: Simulated;Wash/dry face;Set up Where Assessed - Grooming: Supported sitting Upper Body Bathing: Simulated;Chest;Right arm;Left arm;Abdomen;Set up;Supervision/safety Where Assessed - Upper Body Bathing: Unsupported sitting Lower Body Bathing: Simulated;Moderate assistance (without AE) Where Assessed - Lower Body Bathing: Supported sit to stand Upper Body Dressing: Simulated;Supervision/safety;Set up Where Assessed - Upper Body Dressing: Unsupported sitting Lower Body Dressing: Performed;Moderate assistance (with reacher to don underwear.) Where Assessed - Lower  Body Dressing: Supported sit to Pharmacist, hospital: Performed;Moderate assistance Toilet Transfer Method: Other (comment) (with walker to 3in1) Toilet Transfer Equipment: Raised toilet seat with arms (or 3-in-1 over toilet) Toileting - Clothing Manipulation and Hygiene: Performed;Moderate assistance Where Assessed - Toileting Clothing Manipulation and Hygiene: Sit to stand from 3-in-1 or toilet Equipment Used: Long-handled shoe horn;Long-handled sponge;Reacher;Rolling walker;Sock aid ADL Comments: Pt practiced with reacher to don underwear. Needs more practice with AE. Pt also has difficulty with RW safety--tends to push walker either too far in front or stays too close. Especially more difficult for pt in tight spaces such as the bathroom. Daughter present and practiced hands on also.     OT Diagnosis: Generalized weakness  OT Problem List: Decreased strength;Decreased knowledge of use of DME or AE;Decreased knowledge of precautions;Decreased activity tolerance;Pain OT Treatment Interventions: Self-care/ADL training;Therapeutic activities;DME and/or AE instruction;Patient/family education   OT Goals(Current goals can be found in the care plan section) Acute Rehab OT Goals Patient Stated Goal: to walk, be able to go home OT Goal Formulation: With patient/family Time For Goal Achievement: 08/29/12 Potential to Achieve Goals: Good ADL Goals Pt Will Perform Grooming: with min assist;standing Pt Will Perform Lower Body Bathing: with min assist;with adaptive equipment;sit to/from stand Pt Will Perform Lower Body Dressing: with min assist;with adaptive equipment;sit to/from stand Pt Will Transfer to Toilet: with min assist;ambulating;bedside commode Pt Will Perform Toileting - Clothing Manipulation and hygiene: with min guard assist;sit to/from stand Pt Will Perform Tub/Shower Transfer: with min assist;Shower transfer;3 in 1  Visit Information  Last OT Received On: 08/22/12 Assistance  Needed: +1 History of Present Illness: 72 y.o. female admitted for revision of R THA liner and head ball 2* multiple dislocations.        Prior Functioning     Home  Living Family/patient expects to be discharged to:: Private residence Living Arrangements: Children Available Help at Discharge: Family;Available 24 hours/day Type of Home: House Home Access: Level entry Home Layout: One level Home Equipment: Walker - 2 wheels;Bedside commode;Shower seat - built in;Cane - single point;Adaptive equipment Adaptive Equipment: Sock aid Prior Function Level of Independence: Needs assistance Gait / Transfers Assistance Needed: independent with walker or cane ADL's / Homemaking Assistance Needed: assist for bathing LEs, able to dress independently Comments: used walker Communication Communication: No difficulties         Vision/Perception     Cognition  Cognition Arousal/Alertness: Awake/alert Behavior During Therapy: Impulsive Overall Cognitive Status: Within Functional Limits for tasks assessed    Extremity/Trunk Assessment Upper Extremity Assessment Upper Extremity Assessment: Overall WFL for tasks assessed Lower Extremity Assessment Lower Extremity Assessment: Overall WFL for tasks assessed     Mobility Bed Mobility Supine to Sit: 4: Min guard;HOB elevated Details for Bed Mobility Assistance:  cues for safety and posterior hip precautions Transfers Transfers: Sit to Stand;Stand to Sit Sit to Stand: 4: Min assist;With upper extremity assist;From chair/3-in-1 Stand to Sit: 4: Min assist;With upper extremity assist;To chair/3-in-1 Details for Transfer Assistance: verbal cues for hand placement     Exercise Total Joint Exercises Heel Slides: AAROM;Right;Supine;5 reps Hip ABduction/ADduction: AAROM;Right;Supine;5 reps   Balance Balance Balance Assessed: Yes Static Standing Balance Static Standing - Balance Support: Left upper extremity supported Static Standing -  Level of Assistance: 4: Min assist Static Standing - Comment/# of Minutes: pt does lean posteriorly at times.   End of Session OT - End of Session Activity Tolerance: Patient limited by fatigue;Other (comment) (gets SOB and fatigued) Patient left: in chair;with family/visitor present;with call bell/phone within reach  GO     Lennox Laity  [phone] 08/22/2012, 11:40 AM

## 2012-08-22 NOTE — Progress Notes (Signed)
Physical Therapy Treatment Patient Details Name: [name] MRN: [id number] DOB: 08-Apr-1940 Today's Date: 08/22/2012 Time: 4098-1191 PT Time Calculation (min): 38 min  PT Assessment / Plan / Recommendation  PT Comments   Pt continues to have decr. sats to 85% on RA and HR 125. RN aware. Placed on 2 l w/ sats to 95%, HR  99. Pt is mildly impulsive and noted to be unsteady with gait, decr. Safety with RW . At times RW is lifted from floor and has pt posterior losses of balance. Pt has ataxic like gait. Will work with pt and family to ensure safety at DC.  Follow Up Recommendations  Home health PT;Supervision/Assistance - 24 hour     Does the patient have the potential to tolerate intense rehabilitation     Barriers to Discharge        Equipment Recommendations  None recommended by PT    Recommendations for Other Services    Frequency 7X/week   Progress towards PT Goals Progress towards PT goals: Progressing toward goals  Plan Current plan remains appropriate    Precautions / Restrictions Precautions Precautions: Posterior Hip;Fall Precaution Comments: impulsive Restrictions Weight Bearing Restrictions: No RLE Weight Bearing: Weight bearing as tolerated   Pertinent Vitals/Pain After ambulating 85% 125 HR. Pain 8, received medication, ice.    Mobility  Bed Mobility Supine to Sit: 4: Min guard;HOB elevated Details for Bed Mobility Assistance:  cues for safety and posterior hip precautions Transfers Sit to Stand: 4: Min assist;From bed;From chair/3-in-1;With upper extremity assist;With armrests Stand to Sit: 4: Min assist;To bed;With upper extremity assist;With armrests;To chair/3-in-1 Details for Transfer Assistance: VCs hand placement, RLE placement. hip precautions. Ambulation/Gait Ambulation/Gait Assistance: 4: Min assist Ambulation Distance (Feet): 150 Feet Assistive device: Rolling walker Ambulation/Gait Assistance Details: Pt is almost ataxic, at times is not  pushing down into RW, RW lifts up, pt w/ posterior loss of balance. frequent cues for safety.  Gait Pattern: Step-to pattern;Decreased step length - right;Decreased step length - left;Step-through pattern;Ataxic Gait velocity: fast, cues to slow down. General Gait Details: VCs for sequencing    Exercises Total Joint Exercises Heel Slides: AAROM;Right;Supine;5 reps Hip ABduction/ADduction: AAROM;Right;Supine;5 reps   PT Diagnosis:    PT Problem List:   PT Treatment Interventions:     PT Goals (current goals can now be found in the care plan section)    Visit Information  Last PT Received On: 08/22/12 Assistance Needed: +1 History of Present Illness: 72 y.o. female admitted for revision of R THA liner and head ball 2* multiple dislocations.     Subjective Data      Cognition  Cognition Arousal/Alertness: Awake/alert Behavior During Therapy: Impulsive    Balance  Balance Balance Assessed: Yes Static Standing Balance Static Standing - Balance Support: Bilateral upper extremity supported Static Standing - Level of Assistance: 4: Min assist Static Standing - Comment/# of Minutes: pt does lean posteriorly at times.  End of Session PT - End of Session Equipment Utilized During Treatment: Gait belt Activity Tolerance: Patient tolerated treatment well Patient left: in chair;with call bell/phone within reach Nurse Communication: Mobility status (decr. sats/incr HR)   GP     Rada Hay 08/22/2012, 9:32 AM Blanchard Kelch PT [phone]

## 2012-08-22 NOTE — Progress Notes (Signed)
   Subjective: 1 Day Post-Op Procedure(s) (LRB): RIGHT TOTAL HIP REVISION LINER AND HEAD BALL (Right)   Patient reports pain as mild, pain well controlled. No events throughout the night. Ready to be discharged home if she does well with PT and pain stays well controlled.   Objective:   VITALS:   Filed Vitals:   08/22/12 0540  BP: 104/64  Pulse: 95  Temp: 97.7 F (36.5 C)  Resp: 16    Neurovascular intact Dorsiflexion/Plantar flexion intact Incision: dressing C/D/I No cellulitis present Compartment soft  LABS  Recent Labs  08/22/12 0440  HGB 12.8  HCT 38.6  WBC 16.7*  PLT 224     Recent Labs  08/22/12 0440  NA 137  K 4.0  BUN 16  CREATININE 0.99  GLUCOSE 145*     Assessment/Plan: 1 Day Post-Op Procedure(s) (LRB): RIGHT TOTAL HIP REVISION LINER AND HEAD BALL (Right) Foley cath d/c'ed Advance diet Up with therapy D/C IV fluids Discharge home with home health Follow up in 2 weeks at [facility]. Follow up with OLIN,Debra Colon D in 2 weeks.  Contact information:  [facility] [address], [address] [place] [address] [phone]    Obese (BMI 30-39.9) Estimated body mass index is 30.53 kg/(m^2) as calculated from the following:   Height as of this encounter: 5\' 7"  (1.702 m).   Weight as of this encounter: 88.451 kg (195 lb). Patient also counseled that weight may inhibit the healing process Patient counseled that losing weight will help with future health issues     Anastasio Auerbach. Labrisha Wuellner   PAC  08/22/2012, 8:09 AM

## 2012-08-23 ENCOUNTER — Inpatient Hospital Stay (HOSPITAL_COMMUNITY): Payer: Medicare Other

## 2012-08-23 LAB — CBC
HCT: 39 % (ref 36.0–46.0)
MCV: 92.4 fL (ref 78.0–100.0)
Platelets: 238 10*3/uL (ref 150–400)
RBC: 4.22 MIL/uL (ref 3.87–5.11)
WBC: 16.2 10*3/uL — ABNORMAL HIGH (ref 4.0–10.5)

## 2012-08-23 LAB — BASIC METABOLIC PANEL
CO2: 31 mEq/L (ref 19–32)
Chloride: 100 mEq/L (ref 96–112)
Creatinine, Ser: 0.95 mg/dL (ref 0.50–1.10)

## 2012-08-23 NOTE — Progress Notes (Signed)
Occupational Therapy Treatment Patient Details Name: [name] MRN: [id number] DOB: [birth year] Today's Date: 08/23/2012 Time: 4098-1191 OT Time Calculation (min): 39 min  OT Assessment / Plan / Recommendation  OT comments  Pt continues to display decreased recall of THPs and is impulsive with activity. She is also unsteady especially with turns and tighter spaces such as in the bathroom. She needs constant cueing for safety and hands on assist. HR up to 120s with activity and O2 decreased to 78% on RA with dressing/toileting. Up to 93% with rest and HR 99. Nursing aware.    Follow Up Recommendations  Home health OT;Supervision/Assistance - 24 hour    Barriers to Discharge       Equipment Recommendations  None recommended by OT    Recommendations for Other Services    Frequency Min 2X/week   Progress towards OT Goals Progress towards OT goals: Not progressing toward goals - comment (pt still impulsive, unsteady, decrease precaution recall)  Plan Discharge plan remains appropriate    Precautions / Restrictions Precautions Precautions: Posterior Hip;Fall Precaution Booklet Issued: Yes (comment) Precaution Comments: pt able to state 1/3 precautions today. reviewed all with pt Restrictions Weight Bearing Restrictions: No RLE Weight Bearing: Weight bearing as tolerated   Pertinent Vitals/Pain 120s HR with activity and O2 at 78% for lowest. Up to 93% with rest and HR 99.    ADL  Lower Body Dressing: Performed;Moderate assistance (with AE; sock aid and reacher) Where Assessed - Lower Body Dressing: Supported sit to Pharmacist, hospital: Performed;Moderate assistance Toilet Transfer Method: Other (comment) (with walker into bathroom. very unsteady) Acupuncturist: Raised toilet seat with arms (or 3-in-1 over toilet) Toileting - Clothing Manipulation and Hygiene: Performed;Moderate assistance Where Assessed - Toileting Clothing Manipulation and Hygiene: Sit to stand  from 3-in-1 or toilet Equipment Used: Reacher;Rolling walker;Sock aid ADL Comments: Pt still very impulsive, trying to stand multiple times at EOB before gripper socks donned. Also impulsive with RW use. She is very unsteady especially with turns at times and needs cues for correct sequence of RW. Despite repeated cueing, pt still doesnt remember to extend R LE out in front before sitting and standing. No family present for session. AE kit purchased and in room.     OT Diagnosis:    OT Problem List:   OT Treatment Interventions:     OT Goals(current goals can now be found in the care plan section) Acute Rehab OT Goals Patient Stated Goal: to walk, be able to go home  Visit Information  Last OT Received On: 08/23/12 Assistance Needed: +1 History of Present Illness: 72 y.o. female admitted for revision of R THA liner and head ball 2* multiple dislocations.     Subjective Data      Prior Functioning       Cognition  Cognition Arousal/Alertness: Awake/alert Behavior During Therapy: Impulsive    Mobility  Bed Mobility Bed Mobility: Supine to Sit Supine to Sit: 4: Min guard;HOB elevated Transfers Transfers: Sit to Stand;Stand to Sit Sit to Stand: 4: Min assist;With upper extremity assist;From bed;From chair/3-in-1 Stand to Sit: 4: Min assist;With upper extremity assist;To chair/3-in-1 Details for Transfer Assistance: repeated verbal cues for hand placement and THPs.    Exercises      Balance Balance Balance Assessed: Yes Dynamic Standing Balance Dynamic Standing - Level of Assistance: 4: Min assist;3: Mod assist   End of Session OT - End of Session Activity Tolerance: Other (comment) (desats and HR increased) Patient left: in chair;with  call bell/phone within reach  GO     Lennox Laity [phone] 08/23/2012, 10:31 AM

## 2012-08-23 NOTE — Progress Notes (Signed)
   Subjective: 2 Days Post-Op Procedure(s) (LRB): RIGHT TOTAL HIP REVISION LINER AND HEAD BALL (Right)   Patient reports pain as mild, pain well controlled. No events throughout the night. Feeling better this morning. Ready to be discharged home.  Objective:   VITALS:   Filed Vitals:   08/23/12  BP: 132/78  Pulse: 94  Temp: 98.6 F (37 C)   Resp: 20    Neurovascular intact Dorsiflexion/Plantar flexion intact Incision: dressing C/D/I No cellulitis present Compartment soft  LABS  Recent Labs  08/22/12 0440 08/23/12 0454  HGB 12.8 12.8  HCT 38.6 39.0  WBC 16.7* 16.2*  PLT 224 238     Recent Labs  08/22/12 0440 08/23/12 0454  NA 137 140  K 4.0 3.5  BUN 16 20  CREATININE 0.99 0.95  GLUCOSE 145* 128*     Assessment/Plan: 2 Days Post-Op Procedure(s) (LRB): RIGHT TOTAL HIP REVISION LINER AND HEAD BALL (Right) Up with therapy Discharge home with home health Follow up in 2 weeks at [facility]. Follow up with OLIN,Exodus Kutzer D in 2 weeks.  Contact information:  [facility] [address] [place] [address] [phone]    Obese (BMI 30-39.9)  Estimated body mass index is 30.53 kg/(m^2) as calculated from the following:      Height as of this encounter: 5\' 7"  (1.702 m).      Weight as of this encounter: 88.451 kg (195 lb).  Patient also counseled that weight may inhibit the healing process  Patient counseled that losing weight will help with future health issues   [name]   PAC  08/23/2012, 9:59 AM

## 2012-08-23 NOTE — Progress Notes (Signed)
Physical Therapy Treatment Patient Details Name: [name] MRN: [id number] DOB: [birth year] Today's Date: 08/23/2012 Time: 4098-1191 PT Time Calculation (min): 23 min  PT Assessment / Plan / Recommendation  PT Comments   Pt continues to be confused, decreased safety. Daughter aware of need for pt to have close assistance at home . Pt tolerated negotiating 4 steps. Noted incr. HR but not dyspneic. Pt  To DC home.  Follow Up Recommendations  Home health PT;Supervision/Assistance - 24 hour     Does the patient have the potential to tolerate intense rehabilitation     Barriers to Discharge        Equipment Recommendations  None recommended by PT    Recommendations for Other Services    Frequency 7X/week   Progress towards PT Goals Progress towards PT goals: Progressing toward goals  Plan Current plan remains appropriate    Precautions / Restrictions Precautions Precautions: Posterior Hip;Fall   Pertinent Vitals/Pain States hip is sore.    Mobility  Transfers Sit to Stand: 4: Min assist;With upper extremity assist;From chair/3-in-1 Stand to Sit: 4: Min assist;With upper extremity assist;To chair/3-in-1 Details for Transfer Assistance: repeated verbal cues for hand placement and THPs. Ambulation/Gait Ambulation/Gait Assistance: 4: Min assist;3: Mod assist Ambulation Distance (Feet): 20 Feet Assistive device: Rolling walker Ambulation/Gait Assistance Details: frequent cues for safety, sequence. Gait Pattern: Step-to pattern;Decreased step length - right;Decreased step length - left;Step-through pattern;Ataxic;Left circumduction Gait velocity: fast, cues to slow down. General Gait Details: Pt continues to require steadying assistance at times for balance loss. Stairs: Yes Stairs Assistance: 1: +2 Total assist Stairs Assistance Details (indicate cue type and reason): daughter present to assist pt with steps . Daughter appropriately instructs pt.with precautions and  steps. Stair Management Technique: One rail Right;One rail Left;Step to pattern;Forwards;With cane Number of Stairs: 4    Exercises     PT Diagnosis:    PT Problem List:   PT Treatment Interventions:     PT Goals (current goals can now be found in the care plan section)    Visit Information  Last PT Received On: 08/23/12 Assistance Needed: +2    Subjective Data      Cognition  Cognition Arousal/Alertness: Awake/alert Behavior During Therapy: Impulsive Overall Cognitive Status: Impaired/Different from baseline Area of Impairment: Attention;Memory;Following commands;Safety/judgement;Awareness General Comments: daughter reports that medication does cause pt to be confused.    Balance  Static Standing Balance Static Standing - Balance Support: Bilateral upper extremity supported Static Standing - Level of Assistance: 4: Min assist Static Standing - Comment/# of Minutes: pt requires aasistance for balance   End of Session PT - End of Session Equipment Utilized During Treatment: Gait belt Activity Tolerance: Patient tolerated treatment well Patient left: in chair;with family/visitor present Nurse Communication: Mobility status   GP     Rada Hay 08/23/2012, 2:55 PM

## 2012-08-23 NOTE — Progress Notes (Signed)
Pt to d/c home with [facility]. No DME needs. AVS reviewed and "My Chart" discussed with pt. Pt capable of verbalizing medications and follow-up appointments. Remains hemodynamically stable. No signs and symptoms of distress. Educated pt to return to ER in the case of SOB, dizziness, or chest pain.

## 2012-08-24 NOTE — Discharge Summary (Signed)
Physician Discharge Summary  Patient ID: [name] MRN: [id number] DOB/AGE: [id number] 74 y.o.  Admit date: 08/21/2012 Discharge date: 08/23/2012   Procedures:  Procedure(s) (LRB): RIGHT TOTAL HIP REVISION LINER AND HEAD BALL (Right)  Attending Physician:  Dr. Durene Romans   Admission Diagnoses:   Right hip pain s/p right total hip arthroplasty  Discharge Diagnoses:  Principal Problem:   S/P right hip revision Active Problems:   Obese  Past Medical History  Diagnosis Date  . Hypertension   . Depression     no problems at this time  . GERD (gastroesophageal reflux disease)     no problems at this time.  Marland Kitchen Anxiety   . Arthritis   . Osteoporosis   . Skin cancer 1987    Left leg  . colon cancer 1987  . Breast cancer 2005    left    HPI: [name], 72 y.o. female, has a history of pain and functional disability in the right hip due to multiple dislocations. The indications for the revision total hip arthroplasty are multiple dislocations, the first being in August of 2013, then September of 2013 and then again in April of 2014. Onset of symptoms was abrupt starting 1 years ago with rapidlly worsening course since that time. Prior procedures on the right hip include arthroplasty,in August of 2011 by Dr. Darrelyn Hillock. Patient currently rates pain in the right hip as almost nothing but will go to a 10 out of 10 with a dislocation. There is pain that interfers with activities of daily living. Patient has evidence of previous total hip arthroplasty by imaging studies. This condition presents safety issues increasing the risk of falls. There is no current active signs of infection. Risks, benefits and expectations were discussed with the patient. Patient understand the risks, benefits and expectations and wishes to proceed with surgery.  PCP: Provider Not In System   Discharged Condition: good  Hospital Course:  Patient underwent the above stated procedure on 08/21/2012.  Patient tolerated the procedure well and brought to the recovery room in good condition and subsequently to the floor.  POD #1 BP: 104/64 ; Pulse: 95 ; Temp: 97.7 F (36.5 C) ; Resp: 16 Pt's foley was removed. IV was changed to a saline lock. Patient reports pain as mild, pain well controlled. No events throughout the night. Ready to be discharged home if she does well with PT and pain stays well controlled. Neurovascular intact, dorsiflexion/plantar flexion intact, incision: dressing C/D/I, no cellulitis present and compartment soft.   LABS  Basename    HGB  12.8  HCT  38.6   POD #2  BP: 132/78 ; Pulse: 94 ; Temp: 98.6 F (37 C) ; Resp: 20 Patient reports pain as mild, pain well controlled. No events throughout the night. Feeling better this morning. Ready to be discharged home. Neurovascular intact, dorsiflexion/plantar flexion intact, incision: dressing C/D/I, no cellulitis present and compartment soft.   LABS  Basename    HGB  12.8  HCT  39.0    Discharge Exam: General appearance: alert, cooperative and no distress Extremities: Homans sign is negative, no sign of DVT, no edema, redness or tenderness in the calves or thighs and no ulcers, gangrene or trophic changes  Disposition:   Home or Self Care with follow up in 2 weeks   Follow-up Information   Follow up with [name], MD. Schedule an appointment as soon as possible for a visit in 2 weeks.   Contact information:  [address] [place] [address] [phone]       Discharge Orders   Future Orders Complete By Expires     Call MD / Call 911  As directed     Comments:      If you experience chest pain or shortness of breath, CALL 911 and be transported to the hospital emergency room.  If you develope a fever above 101 F, pus (white drainage) or increased drainage or redness at the wound, or calf pain, call your surgeon's office.    Change dressing  As directed     Comments:       Maintain surgical dressing for 10-14 days, then replace with 4x4 guaze and tape. Keep the area dry and clean.    Constipation Prevention  As directed     Comments:      Drink plenty of fluids.  Prune juice may be helpful.  You may use a stool softener, such as Colace (over the counter) 100 mg twice a day.  Use MiraLax (over the counter) for constipation as needed.    Diet - low sodium heart healthy  As directed     Discharge instructions  As directed     Comments:      Maintain surgical dressing for 10-14 days, then replace with gauze and tape. Keep the area dry and clean until follow up. Follow up in 2 weeks at [facility]. Call with any questions or concerns.    Driving restrictions  As directed     Comments:      No driving for 4 weeks    Increase activity slowly as tolerated  As directed     TED hose  As directed     Comments:      Use stockings (TED hose) for 2 weeks on both leg(s).  You may remove them at night for sleeping.    Weight bearing as tolerated  As directed          Medication List    STOP taking these medications       HYDROcodone-acetaminophen 5-325 MG per tablet  Commonly known as:  NORCO/VICODIN  Replaced by:  HYDROcodone-acetaminophen 7.5-325 MG per tablet      TAKE these medications       alendronate 70 MG tablet  Commonly known as:  FOSAMAX  Take 70 mg by mouth every 7 (seven) days. Take with a full glass of water on an empty stomach.Wednesday     calcium carbonate 500 MG chewable tablet  Commonly known as:  TUMS - dosed in mg elemental calcium  Chew 2 tablets by mouth as needed for heartburn.     clonazePAM 1 MG tablet  Commonly known as:  KLONOPIN  Take 1 mg by mouth at bedtime. Anxiety     diphenhydrAMINE 25 mg capsule  Commonly known as:  BENADRYL  Take 25 mg by mouth at bedtime as needed for itching or allergies.     DSS 100 MG Caps  Take 100 mg by mouth 2 (two) times daily.     DULoxetine 60 MG capsule  Commonly known as:   CYMBALTA  Take 60 mg by mouth every evening.     ferrous sulfate 325 (65 FE) MG tablet  Take 1 tablet (325 mg total) by mouth 3 (three) times daily after meals.     HYDROcodone-acetaminophen 7.5-325 MG per tablet  Commonly known as:  NORCO  Take 1-2 tablets by mouth every 4 (four) hours as needed  for pain.     levothyroxine 112 MCG tablet  Commonly known as:  SYNTHROID, LEVOTHROID  Take 112 mcg by mouth daily before breakfast.     multivitamin with minerals Tabs  Take 1 tablet by mouth daily.     omeprazole 40 MG capsule  Commonly known as:  PRILOSEC  Take 40 mg by mouth daily.     polyethylene glycol packet  Commonly known as:  MIRALAX / GLYCOLAX  Take 17 g by mouth 2 (two) times daily.     potassium chloride 10 MEQ tablet  Commonly known as:  K-DUR  Take 10 mEq by mouth every morning.     rivaroxaban 10 MG Tabs tablet  Commonly known as:  XARELTO  Take 1 tablet (10 mg total) by mouth daily.     tiZANidine 4 MG capsule  Commonly known as:  ZANAFLEX  Take 1 capsule (4 mg total) by mouth 3 (three) times daily as needed for muscle spasms.     triamterene-hydrochlorothiazide 37.5-25 MG per tablet  Commonly known as:  MAXZIDE-25  Take 1 tablet by mouth every morning.         Signed: Anastasio Auerbach. Sheriff Rodenberg   PAC  08/24/2012, 10:23 AM

## 2013-02-10 IMAGING — CR DG FEMUR 2+V*R*
5 series · 5 of 5 positions shown · non-contrast
Comparison: Films earlier this date.

CLINICAL DATA: Right hip dislocation - status post reduction.

RIGHT FEMUR - 2 VIEW

[w femur distal lat right (1 of 2)]
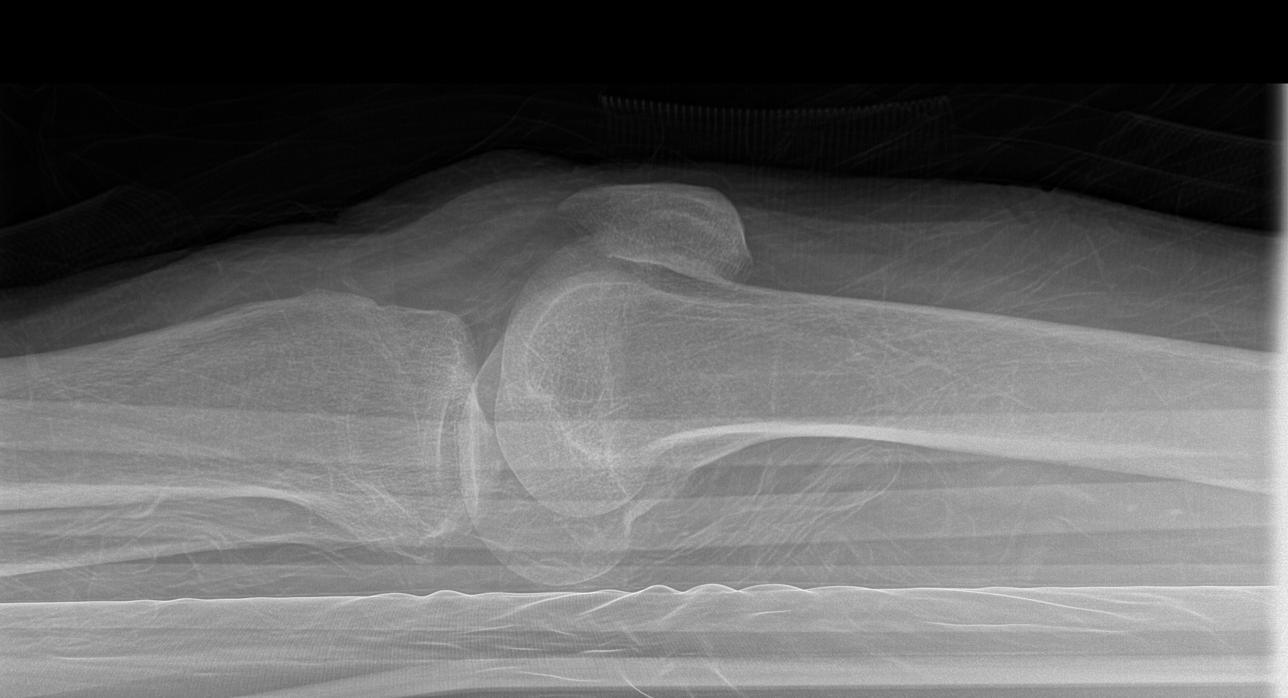

[w femur distal lat right (2 of 2)]
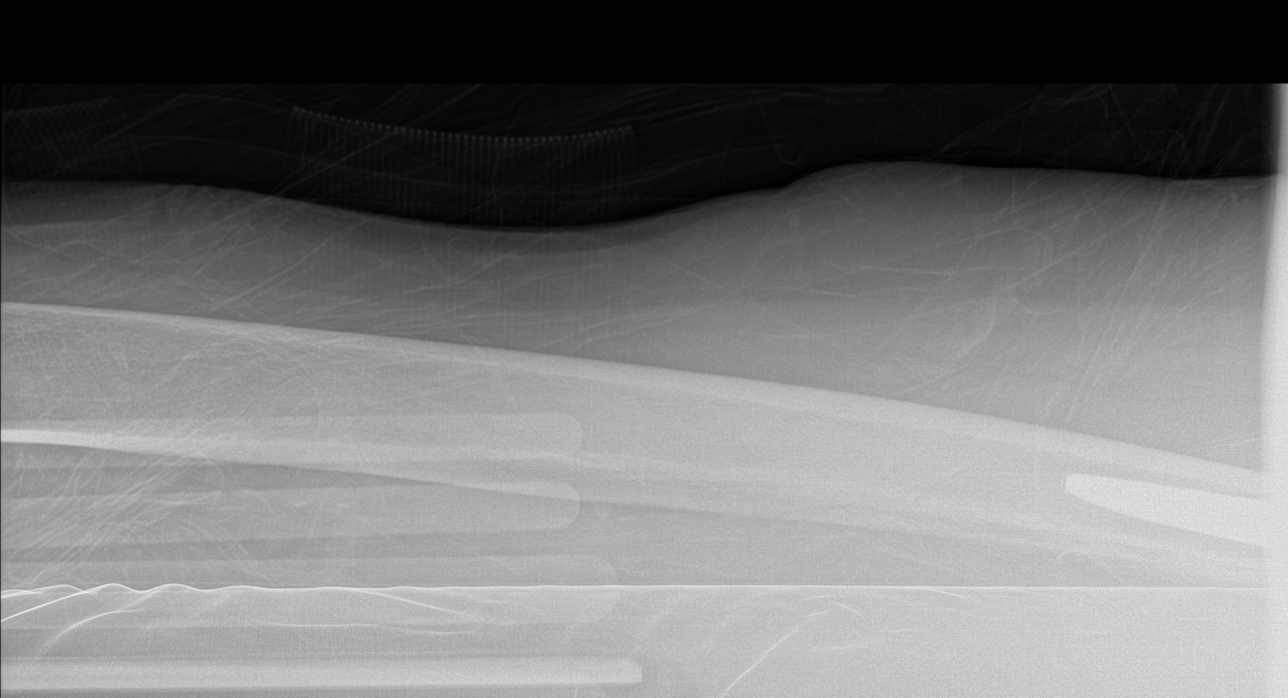

[w hip lat right]
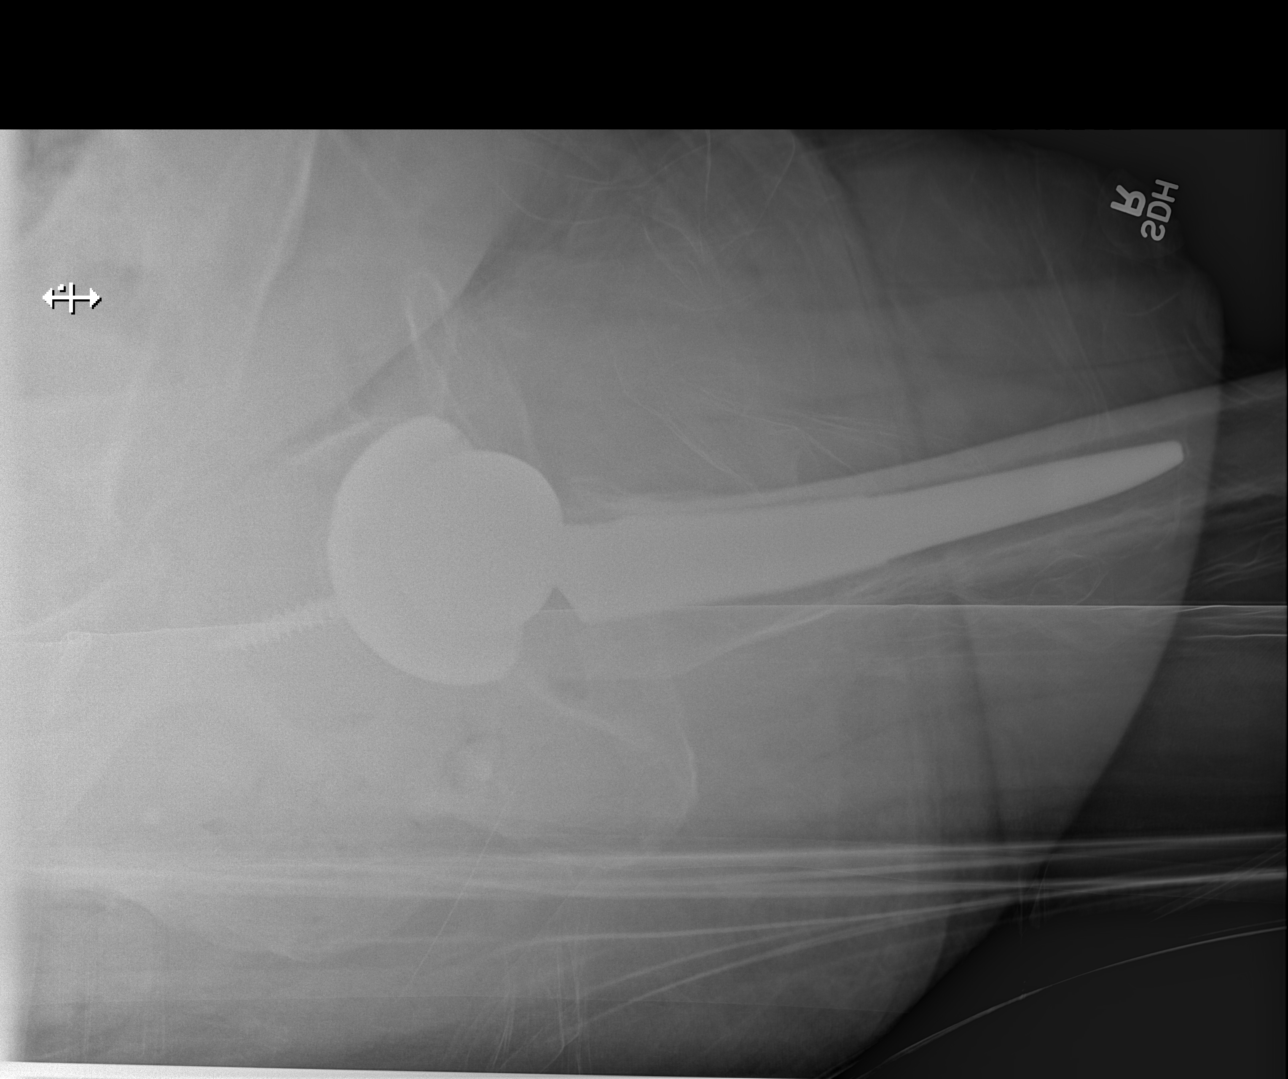

[x femur proximal ap right (1 of 2)]
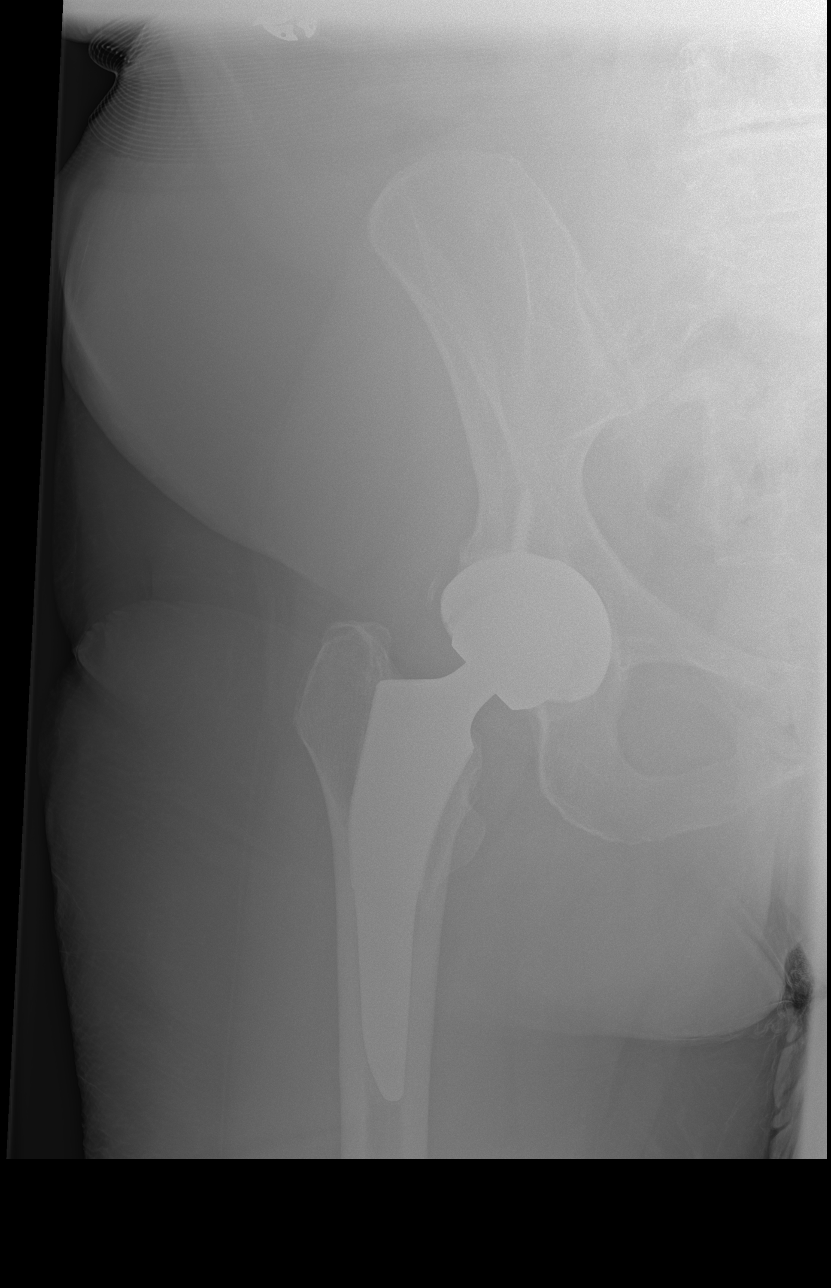

[x femur proximal ap right (2 of 2)]
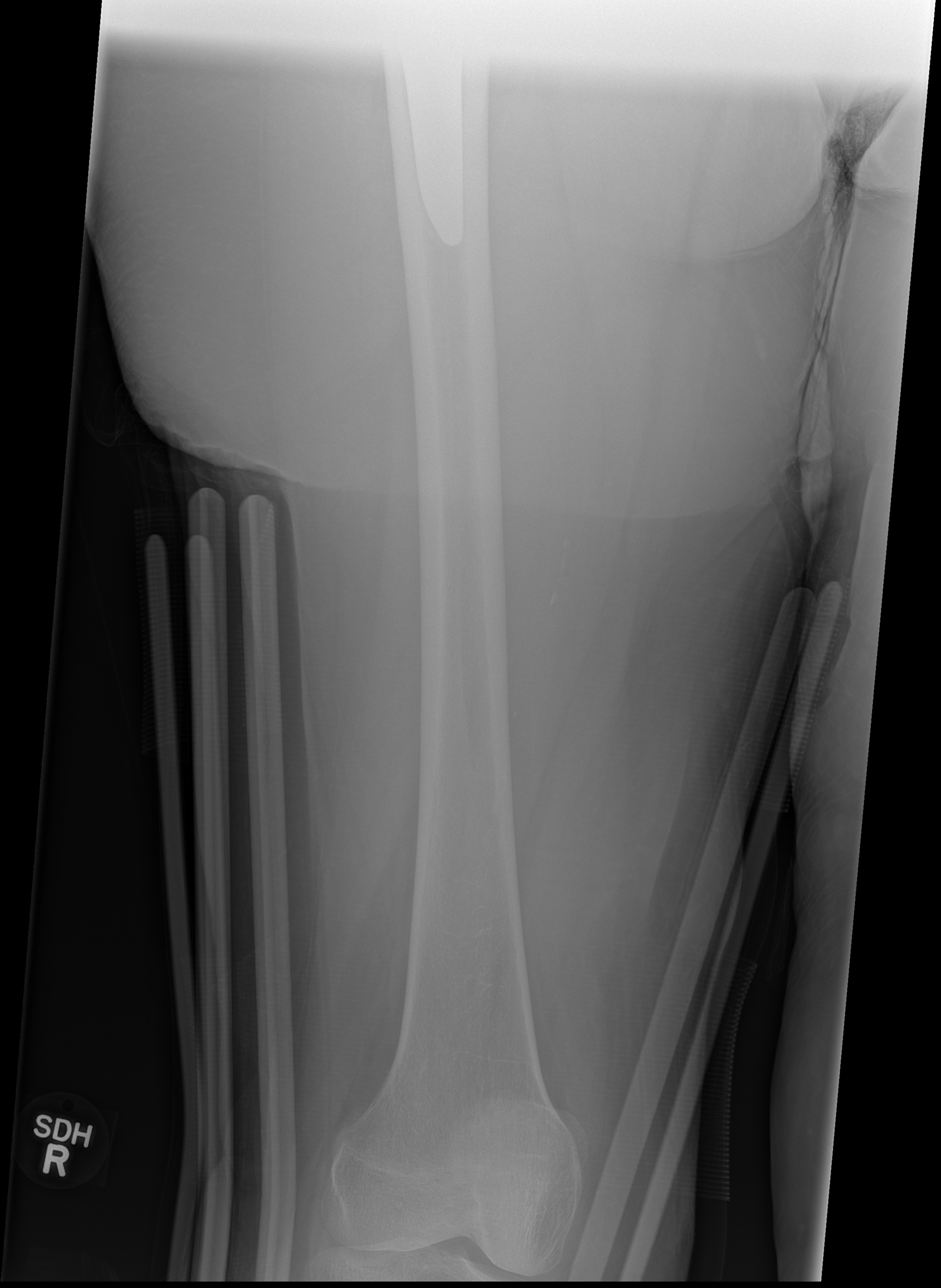

[5 of 5 positions shown; findings below may reference images not displayed]

FINDINGS: The right total hip arthroplasty is located.
There is no evidence of acute fracture, subluxation or dislocation.
No complicating hardware features are identified.
Diffuse osteopenia is present.
IMPRESSION: Right total hip arthroplasty without definite complicating
features.

## 2013-07-02 ENCOUNTER — Emergency Department (HOSPITAL_BASED_OUTPATIENT_CLINIC_OR_DEPARTMENT_OTHER)
Admission: EM | Admit: 2013-07-02 | Discharge: 2013-07-02 | Disposition: A | Discharge status: Home / Self Care | Payer: Medicare Other | Attending: Emergency Medicine | Admitting: Emergency Medicine

## 2013-07-02 ENCOUNTER — Encounter (HOSPITAL_BASED_OUTPATIENT_CLINIC_OR_DEPARTMENT_OTHER): Payer: Self-pay | Admitting: Emergency Medicine

## 2013-07-02 DIAGNOSIS — Z96649 Presence of unspecified artificial hip joint: Secondary | ICD-10-CM | POA: Insufficient documentation

## 2013-07-02 DIAGNOSIS — D692 Other nonthrombocytopenic purpura: Secondary | ICD-10-CM | POA: Insufficient documentation

## 2013-07-02 DIAGNOSIS — Z85038 Personal history of other malignant neoplasm of large intestine: Secondary | ICD-10-CM | POA: Insufficient documentation

## 2013-07-02 DIAGNOSIS — M129 Arthropathy, unspecified: Secondary | ICD-10-CM | POA: Insufficient documentation

## 2013-07-02 DIAGNOSIS — Z85828 Personal history of other malignant neoplasm of skin: Secondary | ICD-10-CM | POA: Insufficient documentation

## 2013-07-02 DIAGNOSIS — F411 Generalized anxiety disorder: Secondary | ICD-10-CM | POA: Insufficient documentation

## 2013-07-02 DIAGNOSIS — Z853 Personal history of malignant neoplasm of breast: Secondary | ICD-10-CM | POA: Insufficient documentation

## 2013-07-02 DIAGNOSIS — Z79899 Other long term (current) drug therapy: Secondary | ICD-10-CM | POA: Insufficient documentation

## 2013-07-02 DIAGNOSIS — I10 Essential (primary) hypertension: Secondary | ICD-10-CM | POA: Insufficient documentation

## 2013-07-02 DIAGNOSIS — Z87891 Personal history of nicotine dependence: Secondary | ICD-10-CM | POA: Insufficient documentation

## 2013-07-02 DIAGNOSIS — F3289 Other specified depressive episodes: Secondary | ICD-10-CM | POA: Insufficient documentation

## 2013-07-02 DIAGNOSIS — F329 Major depressive disorder, single episode, unspecified: Secondary | ICD-10-CM | POA: Insufficient documentation

## 2013-07-02 DIAGNOSIS — M810 Age-related osteoporosis without current pathological fracture: Secondary | ICD-10-CM | POA: Insufficient documentation

## 2013-07-02 DIAGNOSIS — Z7983 Long term (current) use of bisphosphonates: Secondary | ICD-10-CM | POA: Insufficient documentation

## 2013-07-02 NOTE — Discharge Instructions (Signed)
Your skin discoloration is due from a small ruptured blood vessel.  It is not dangerous and should be reabsorbed in a few weeks.  You need to have it rechecked if it continues to enlarge, becomes red or painful, or if you have increased pain or swelling in your legs.

## 2013-07-02 NOTE — ED Notes (Signed)
Pt c/o left lower leg pain with brusing area noted x 2 hrs

## 2013-07-02 NOTE — ED Provider Notes (Signed)
CSN: [id number]     Arrival date & time 07/02/13  1557 History  This chart was scribed for Malvin Johns, MD by Elby Beck, ED Scribe. This patient was seen in room MH08/MH08 and the patient's care was started at 4:41 PM.   Chief Complaint  Patient presents with  . Leg Pain    The history is provided by the patient. No language interpreter was used.    HPI Comments: [name] is a 73 y.o. female who presents to the Emergency Department complaining of an area of intermittent, mild "stinging" pain with associated bruising to her left lower leg noticed at 2:00 PM today. She denies any known injury to have inset this pain/bruising.  She denies any associated calf or thigh pain or persistant swelling to her legs. She states that she has pain in her heels at baseline, with no recent changes. She states that she is not on any anticoagulant medications. She has a history of right hip surgery as well as arthritis. She denies any new SOB or CP.   Past Medical History  Diagnosis Date  . Hypertension   . Depression     no problems at this time  . GERD (gastroesophageal reflux disease)     no problems at this time.  Marland Kitchen Anxiety   . Arthritis   . Osteoporosis   . Skin cancer 1987    Left leg  . colon cancer 1987  . Breast cancer 2005    left   Past Surgical History  Procedure Laterality Date  . Colon surgery    . Thyroid surgery    . Skin cancer excision    . Breast surgery      left extremity restriction  . Mastectomy    . Cholecystectomy  70's  . Joint replacement Right 2011    hip  . Hand surgery Bilateral   . Multiple tooth extractions    . Total hip revision Right 08/21/2012    Procedure: RIGHT TOTAL HIP REVISION LINER AND HEAD BALL;  Surgeon: Mauri Pole, MD;  Location: [facility] ORS;  Service: Orthopedics;  Laterality: Right;   History reviewed. No pertinent family history. History  Substance Use Topics  . Smoking status: Former Smoker -- 1.00 packs/day for 50 years   Types: Cigarettes    Quit date: 08/23/2004  . Smokeless tobacco: Never Used  . Alcohol Use: No   OB History   Grav Para Term Preterm Abortions TAB SAB Ect Mult Living                 Review of Systems  Constitutional: Negative for fever.  Cardiovascular: Positive for leg swelling (resolved).  Gastrointestinal: Negative for nausea and vomiting.  Musculoskeletal: Negative for back pain and neck pain.       Left lower leg pain and bruising  Skin: Negative for wound.  Neurological: Negative for weakness, numbness and headaches.    Allergies  Fentanyl; Percocet; and Aspirin  Home Medications   Prior to Admission medications   Medication Sig Start Date End Date Taking? Authorizing Provider  alendronate (FOSAMAX) 70 MG tablet Take 70 mg by mouth every 7 (seven) days. Take with a full glass of water on an empty stomach.Wednesday    Historical Provider, MD  calcium carbonate (TUMS - DOSED IN MG ELEMENTAL CALCIUM) 500 MG chewable tablet Chew 2 tablets by mouth as needed for heartburn.     Historical Provider, MD  clonazePAM (KLONOPIN) 1 MG tablet Take 1 mg by mouth  at bedtime. Anxiety    Historical Provider, MD  diphenhydrAMINE (BENADRYL) 25 mg capsule Take 25 mg by mouth at bedtime as needed for itching or allergies.    Historical Provider, MD  DULoxetine (CYMBALTA) 60 MG capsule Take 60 mg by mouth every evening.    Historical Provider, MD  HYDROcodone-acetaminophen (NORCO) 7.5-325 MG per tablet Take 1-2 tablets by mouth every 4 (four) hours as needed for pain. 08/22/12   Lucille Passy Babish, PA-C  levothyroxine (SYNTHROID, LEVOTHROID) 112 MCG tablet Take 112 mcg by mouth daily before breakfast.    Historical Provider, MD  Multiple Vitamin (MULTIVITAMIN WITH MINERALS) TABS Take 1 tablet by mouth daily.    Historical Provider, MD  triamterene-hydrochlorothiazide (MAXZIDE-25) 37.5-25 MG per tablet Take 1 tablet by mouth every morning.     Historical Provider, MD   Triage Vitals: BP 147/90   Pulse 135  Temp(Src) 98.5 F (36.9 C)  Resp 16  Ht 5\' 7"  (1.702 m)  Wt 185 lb (83.915 kg)  BMI 28.97 kg/m2  SpO2 100%  Physical Exam  Constitutional: She is oriented to person, place, and time. She appears well-developed and well-nourished.  HENT:  Head: Normocephalic and atraumatic.  Neck: Normal range of motion. Neck supple.  Cardiovascular: Normal rate.   Pulmonary/Chest: Effort normal.  Neurological: She is alert and oriented to person, place, and time.  Skin: Skin is warm and dry.  Small area (2cm) of purpura to left lateral lower leg.  No erythema or warmth.  No drainage.  Nontender on exam.  No pedal edema or calf tenderness  Psychiatric: She has a normal mood and affect.    ED Course  Procedures (including critical care time)  DIAGNOSTIC STUDIES: Oxygen Saturation is 100% on RA, normal by my interpretation.    COORDINATION OF CARE: 4:47 PM- Discussed that pt is not suspected to have a blood clot. Pt advised of plan for treatment and pt agrees.  Labs Review Labs Reviewed - No data to display  Imaging Review No results found.   EKG Interpretation None      MDM   Final diagnoses:  Purpura    Pt has localized area of purpura to her left lower leg, likely traumatic, no signs of infection.  No symptoms consistent with DVT.   I personally performed the services described in this documentation, which was scribed in my presence.  The recorded information has been reviewed and considered.   Malvin Johns, MD 07/02/13 [phone]

## 2013-09-14 IMAGING — CR DG HIP 1V PORT*R*
1 series · 1 of 1 positions shown · non-contrast
Comparison: Right hip and femur films of 10/29/2011.

CLINICAL DATA: Post reduction

PORTABLE RIGHT HIP - 1 VIEW

[AP]
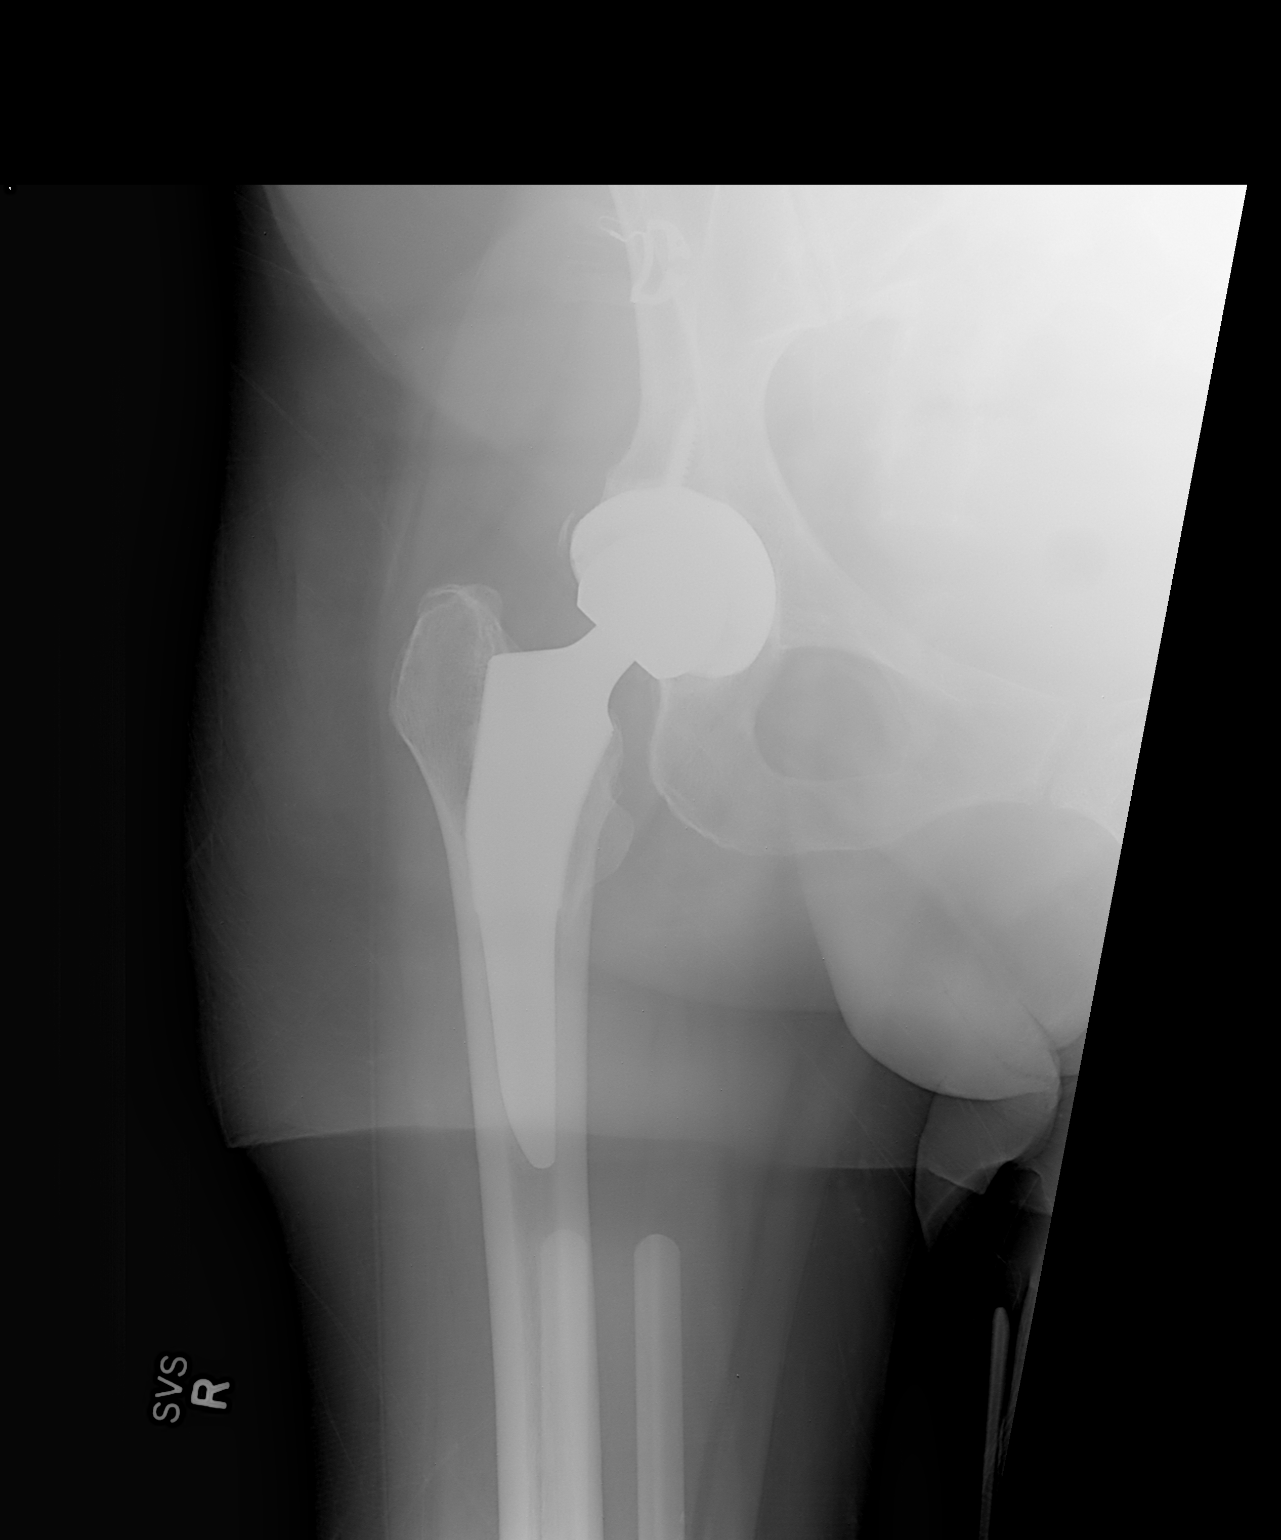

[1 of 1 positions shown; findings below may reference images not displayed]

FINDINGS: A single portable view shows the acetabular and femoral
components of the right hip replacement to be in good position.  No
fracture is seen..
IMPRESSION: Right hip replacement components in good position.  No fracture.

## 2015-03-10 ENCOUNTER — Other Ambulatory Visit: Payer: Self-pay

## 2015-03-10 DIAGNOSIS — Z9012 Acquired absence of left breast and nipple: Secondary | ICD-10-CM

## 2015-03-31 ENCOUNTER — Ambulatory Visit
Admission: RE | Admit: 2015-03-31 | Discharge: 2015-03-31 | Disposition: A | Discharge status: Home / Self Care | Payer: Medicare Other | Source: Ambulatory Visit

## 2015-03-31 DIAGNOSIS — Z9012 Acquired absence of left breast and nipple: Secondary | ICD-10-CM

## 2015-04-01 ENCOUNTER — Other Ambulatory Visit: Payer: Self-pay | Admitting: Family Medicine

## 2015-04-01 DIAGNOSIS — R928 Other abnormal and inconclusive findings on diagnostic imaging of breast: Secondary | ICD-10-CM

## 2015-04-04 ENCOUNTER — Ambulatory Visit
Admission: RE | Admit: 2015-04-04 | Discharge: 2015-04-04 | Disposition: A | Discharge status: Home / Self Care | Payer: Medicare Other | Source: Ambulatory Visit | Attending: Family Medicine | Admitting: Family Medicine

## 2015-04-04 DIAGNOSIS — R928 Other abnormal and inconclusive findings on diagnostic imaging of breast: Secondary | ICD-10-CM

## 2015-12-09 ENCOUNTER — Other Ambulatory Visit: Payer: Self-pay | Admitting: Family Medicine

## 2015-12-09 DIAGNOSIS — N6310 Unspecified lump in the right breast, unspecified quadrant: Secondary | ICD-10-CM

## 2015-12-22 ENCOUNTER — Other Ambulatory Visit: Payer: Medicare Other

## 2016-01-05 ENCOUNTER — Ambulatory Visit
Admission: RE | Admit: 2016-01-05 | Discharge: 2016-01-05 | Disposition: A | Discharge status: Home / Self Care | Payer: Medicare Other | Source: Ambulatory Visit | Attending: Family Medicine | Admitting: Family Medicine

## 2016-01-05 DIAGNOSIS — N6310 Unspecified lump in the right breast, unspecified quadrant: Secondary | ICD-10-CM

## 2016-08-26 ENCOUNTER — Other Ambulatory Visit: Payer: Self-pay | Admitting: Family Medicine

## 2016-08-26 DIAGNOSIS — N6310 Unspecified lump in the right breast, unspecified quadrant: Secondary | ICD-10-CM

## 2016-08-31 ENCOUNTER — Other Ambulatory Visit: Payer: Medicare Other

## 2017-06-03 ENCOUNTER — Emergency Department (HOSPITAL_COMMUNITY)
Admission: EM | Admit: 2017-06-03 | Discharge: 2017-06-03 | Disposition: A | Discharge status: Home / Self Care | Payer: Medicare Other | Attending: Emergency Medicine | Admitting: Emergency Medicine

## 2017-06-03 ENCOUNTER — Other Ambulatory Visit: Payer: Self-pay

## 2017-06-03 ENCOUNTER — Emergency Department (HOSPITAL_COMMUNITY): Payer: Medicare Other

## 2017-06-03 ENCOUNTER — Encounter (HOSPITAL_COMMUNITY): Payer: Self-pay | Admitting: *Deleted

## 2017-06-03 DIAGNOSIS — S81812A Laceration without foreign body, left lower leg, initial encounter: Secondary | ICD-10-CM | POA: Insufficient documentation

## 2017-06-03 DIAGNOSIS — Z87891 Personal history of nicotine dependence: Secondary | ICD-10-CM | POA: Insufficient documentation

## 2017-06-03 DIAGNOSIS — I10 Essential (primary) hypertension: Secondary | ICD-10-CM | POA: Insufficient documentation

## 2017-06-03 DIAGNOSIS — R51 Headache: Secondary | ICD-10-CM | POA: Diagnosis not present

## 2017-06-03 DIAGNOSIS — Y93F1 Activity, caregiving, bathing: Secondary | ICD-10-CM | POA: Insufficient documentation

## 2017-06-03 DIAGNOSIS — Z9049 Acquired absence of other specified parts of digestive tract: Secondary | ICD-10-CM | POA: Diagnosis not present

## 2017-06-03 DIAGNOSIS — Z853 Personal history of malignant neoplasm of breast: Secondary | ICD-10-CM | POA: Diagnosis not present

## 2017-06-03 DIAGNOSIS — Y998 Other external cause status: Secondary | ICD-10-CM | POA: Diagnosis not present

## 2017-06-03 DIAGNOSIS — T148XXA Other injury of unspecified body region, initial encounter: Secondary | ICD-10-CM

## 2017-06-03 DIAGNOSIS — W19XXXA Unspecified fall, initial encounter: Secondary | ICD-10-CM

## 2017-06-03 DIAGNOSIS — S0093XA Contusion of unspecified part of head, initial encounter: Secondary | ICD-10-CM | POA: Diagnosis not present

## 2017-06-03 DIAGNOSIS — Y92002 Bathroom of unspecified non-institutional (private) residence single-family (private) house as the place of occurrence of the external cause: Secondary | ICD-10-CM | POA: Diagnosis not present

## 2017-06-03 DIAGNOSIS — S0083XA Contusion of other part of head, initial encounter: Secondary | ICD-10-CM | POA: Diagnosis not present

## 2017-06-03 DIAGNOSIS — S20411A Abrasion of right back wall of thorax, initial encounter: Secondary | ICD-10-CM | POA: Diagnosis not present

## 2017-06-03 DIAGNOSIS — Z9010 Acquired absence of unspecified breast and nipple: Secondary | ICD-10-CM | POA: Diagnosis not present

## 2017-06-03 DIAGNOSIS — W01198A Fall on same level from slipping, tripping and stumbling with subsequent striking against other object, initial encounter: Secondary | ICD-10-CM | POA: Diagnosis not present

## 2017-06-03 LAB — BRAIN NATRIURETIC PEPTIDE: B Natriuretic Peptide: 7.3 pg/mL (ref 0.0–100.0)

## 2017-06-03 LAB — COMPREHENSIVE METABOLIC PANEL WITH GFR
ALT: 13 U/L — ABNORMAL LOW (ref 14–54)
AST: 24 U/L (ref 15–41)
Albumin: 3.5 g/dL (ref 3.5–5.0)
Alkaline Phosphatase: 57 U/L (ref 38–126)
Anion gap: 11 (ref 5–15)
BUN: 19 mg/dL (ref 6–20)
CO2: 31 mmol/L (ref 22–32)
Calcium: 9.3 mg/dL (ref 8.9–10.3)
Chloride: 99 mmol/L — ABNORMAL LOW (ref 101–111)
Creatinine, Ser: 1.01 mg/dL — ABNORMAL HIGH (ref 0.44–1.00)
GFR calc Af Amer: >60 mL/min
GFR calc non Af Amer: 52 mL/min — ABNORMAL LOW
Glucose, Bld: 99 mg/dL (ref 65–99)
Potassium: 3 mmol/L — ABNORMAL LOW (ref 3.5–5.1)
Sodium: 141 mmol/L (ref 135–145)
Total Bilirubin: 0.6 mg/dL (ref 0.3–1.2)
Total Protein: 7.2 g/dL (ref 6.5–8.1)

## 2017-06-03 LAB — CBC WITH DIFFERENTIAL/PLATELET
BASOS ABS: 0 10*3/uL (ref 0.0–0.1)
Basophils Relative: 0 %
Eosinophils Absolute: 0 10*3/uL (ref 0.0–0.7)
Eosinophils Relative: 0 %
HEMATOCRIT: 46.1 % — AB (ref 36.0–46.0)
HEMOGLOBIN: 15.5 g/dL — AB (ref 12.0–15.0)
Lymphocytes Relative: 21 %
Lymphs Abs: 2.2 10*3/uL (ref 0.7–4.0)
MCH: 32 pg (ref 26.0–34.0)
MCHC: 33.6 g/dL (ref 30.0–36.0)
MCV: 95.2 fL (ref 78.0–100.0)
Monocytes Absolute: 0.5 10*3/uL (ref 0.1–1.0)
Monocytes Relative: 5 %
NEUTROS ABS: 7.8 10*3/uL — AB (ref 1.7–7.7)
NEUTROS PCT: 74 %
Platelets: 240 10*3/uL (ref 150–400)
RBC: 4.84 MIL/uL (ref 3.87–5.11)
RDW: 13.3 % (ref 11.5–15.5)
WBC: 10.7 10*3/uL — ABNORMAL HIGH (ref 4.0–10.5)

## 2017-06-03 LAB — I-STAT TROPONIN, ED: Troponin i, poc: 0.01 ng/mL (ref 0.00–0.08)

## 2017-06-03 MED ORDER — HYDROCODONE-ACETAMINOPHEN 5-325 MG PO TABS: 1.0000 | ORAL_TABLET | ORAL | 0 refills | Status: DC | PRN

## 2017-06-03 MED ORDER — IOPAMIDOL (ISOVUE-370) INJECTION 76%
100.0000 mL | Freq: Once | INTRAVENOUS | Status: AC | PRN
  Administered 2017-06-03: 100 mL via INTRAVENOUS

## 2017-06-03 MED ORDER — LIDOCAINE HCL (PF) 1 % IJ SOLN
30.0000 mL | Freq: Once | INTRAMUSCULAR | Status: AC
Start: 2017-06-03 — End: 2017-06-03
  Administered 2017-06-03: 30 mL
  Filled 2017-06-03: qty 30

## 2017-06-03 MED ORDER — FENTANYL CITRATE (PF) 100 MCG/2ML IJ SOLN
100.0000 ug | Freq: Once | INTRAMUSCULAR | Status: AC
  Administered 2017-06-03: 100 ug via INTRAVENOUS
  Filled 2017-06-03: qty 2

## 2017-06-03 MED ORDER — IOPAMIDOL (ISOVUE-300) INJECTION 61%: 100.0000 mL | Freq: Once | INTRAVENOUS | Status: DC | PRN

## 2017-06-03 MED ORDER — IOPAMIDOL (ISOVUE-370) INJECTION 76%
INTRAVENOUS | Status: AC
  Filled 2017-06-03: qty 100

## 2017-06-03 MED ORDER — ONDANSETRON HCL 4 MG/2ML IJ SOLN
4.0000 mg | Freq: Once | INTRAMUSCULAR | Status: AC
  Administered 2017-06-03: 4 mg via INTRAVENOUS
  Filled 2017-06-03: qty 2

## 2017-06-03 NOTE — ED Provider Notes (Signed)
Patient seen and evaluated.  CTA normal.  Room air saturations 91%.  Instructed in the use of spirometer.  I think she is appropriate for discharge home.  Pain medication treatment for her abrasions contusions.   [name], MD 06/03/17 1944

## 2017-06-03 NOTE — ED Triage Notes (Signed)
Patient was in the shower and slipped and fell , hit her head no loc, large laceration to left lower leg.  Bruising to left flank. Patient was given Fentanyl 100 mg enroute by ems. Pateint is alert oriented

## 2017-06-03 NOTE — Discharge Instructions (Signed)
Recheck at Urgent care in 2 days.  Suture removal in 10 days.  Your skin is very thin, sutures may pull through and tear your skin.

## 2017-06-03 NOTE — ED Notes (Signed)
Oxygen therapy removed by MD. Sats down to 80%.

## 2017-06-03 NOTE — ED Provider Notes (Signed)
[facility] EMERGENCY DEPARTMENT Provider Note   CSN: [id number] Arrival date & time: 06/03/17  1111     History   Chief Complaint Chief Complaint  Patient presents with  . Fall    HPI [name] is a 77 y.o. female.  The history is provided by the patient. No language interpreter was used.  Fall  This is a new problem. The current episode started less than 1 hour ago. The problem occurs constantly. The problem has not changed since onset.Nothing aggravates the symptoms. Nothing relieves the symptoms. She has tried nothing for the symptoms. The treatment provided no relief.  Pt reports she slipped and fell in the shower.  Pt hit her head.  Pt reports she did not lose consciousness.  Pt complains of a cut to her leg from the metal trim edge of shower. Pt his her back on side of shower.   Pt did hit her head. No loss of conciousness   Past Medical History:  Diagnosis Date  . Anxiety   . Arthritis   . Breast cancer ([place]) 2005   left  . colon cancer 1987  . Depression    no problems at this time  . GERD (gastroesophageal reflux disease)    no problems at this time.  . Hypertension   . Osteoporosis   . Skin cancer 1987   Left leg    Patient Active Problem List   Diagnosis Date Noted  . Obese 08/22/2012  . 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 BREAST SURGERY     left extremity restriction  . CHOLECYSTECTOMY  70's  . COLON SURGERY    . HAND SURGERY Bilateral   . JOINT REPLACEMENT Right 2011   hip  . MASTECTOMY    . MULTIPLE TOOTH EXTRACTIONS    . SKIN CANCER EXCISION    . THYROID SURGERY    . TOTAL HIP REVISION Right 08/21/2012   Procedure: RIGHT TOTAL HIP REVISION LINER AND HEAD BALL;  Surgeon: Mauri Pole, MD;  Location: [facility];  Service: Orthopedics;  Laterality: Right;     OB History   None      Home Medications    Prior to Admission medications   Medication Sig Start  Date End Date Taking? Authorizing Provider  acetaminophen (TYLENOL) 500 MG tablet Take 1,000 mg by mouth as needed for mild pain.   Yes [provider]  cetirizine (ZYRTEC) 10 MG tablet Take 10 mg by mouth as needed for allergies.   Yes [provider]  clonazePAM (KLONOPIN) 1 MG tablet Take 1 mg by mouth at bedtime. Anxiety   Yes [provider]  DULoxetine (CYMBALTA) 60 MG capsule Take 60 mg by mouth every evening.   Yes [provider]  HYDROcodone-acetaminophen (NORCO) 10-325 MG tablet Take 1 tablet by mouth at bedtime. 05/30/17  Yes [provider]  levothyroxine (SYNTHROID, LEVOTHROID) 88 MCG tablet Take 88 mcg by mouth daily. 03/06/17  Yes [provider]  Multiple Vitamin (MULTIVITAMIN WITH MINERALS) TABS Take 1 tablet by mouth daily.   Yes [provider]  naphazoline-glycerin (CLEAR EYES REDNESS) 0.012-0.2 % SOLN Place 1 drop into both eyes as needed for eye irritation.   Yes [provider]  pravastatin (PRAVACHOL) 20 MG tablet Take 20 mg by mouth daily. 11/20/16  Yes [provider]  triamterene-hydrochlorothiazide (MAXZIDE-25) 37.5-25 MG per tablet Take 1 tablet by mouth every morning.  Yes [provider]  HYDROcodone-acetaminophen (NORCO) 7.5-325 MG per tablet Take 1-2 tablets by mouth every 4 (four) hours as needed for pain. Patient not taking: Reported on 06/03/2017 08/22/12   Danae Orleans, PA-C    Family History No family history on file.  Social History Social History   Tobacco Use  . Smoking status: Former Smoker    Packs/day: 1.00    Years: 50.00    Pack years: 50.00    Types: Cigarettes    Last attempt to quit: 08/23/2004    Years since quitting: 12.7  . Smokeless tobacco: Never Used  Substance Use Topics  . Alcohol use: No  . Drug use: No     Allergies   Fentanyl; Percocet [oxycodone-acetaminophen]; and Aspirin   Review of Systems Review of Systems  All other systems  reviewed and are negative.    Physical Exam Updated Vital Signs BP (!) 131/57 (BP Location: Right Arm)   Pulse 92   Temp (!) 97.5 F (36.4 C) (Oral)   Resp 18   SpO2 (!) 89% Comment: Patient denies home o2 or sob, placed patient on 3 liters [place] and sats increased to 97%  Physical Exam  Constitutional: She is oriented to person, place, and time. She appears well-developed and well-nourished.  HENT:  Head: Normocephalic.  Right Ear: External ear normal.  Left Ear: External ear normal.  Mouth/Throat: Oropharynx is clear and moist.  Eyes: Pupils are equal, round, and reactive to light. EOM are normal.  Neck: Normal range of motion.  Cardiovascular: Normal rate and regular rhythm.  Pulmonary/Chest: Effort normal.  Abrasion right back, scapular area,    Abdominal: Soft. She exhibits no distension.  Musculoskeletal: Normal range of motion.  22cm laceration left lower leg gapping   Neurological: She is alert and oriented to person, place, and time.  Psychiatric: She has a normal mood and affect.  Nursing note and vitals reviewed.    ED Treatments / Results  Labs (all labs ordered are listed, but only abnormal results are displayed) Labs Reviewed  CBC WITH DIFFERENTIAL/PLATELET  COMPREHENSIVE METABOLIC PANEL  BRAIN NATRIURETIC PEPTIDE    EKG None  Radiology Dg Chest 2 View  Result Date: 06/03/2017 CLINICAL DATA:  [place] backwards today getting into the shower. Medial right scapular bruising. Initial encounter. EXAM: CHEST - 2 VIEW COMPARISON:  08/23/2012 FINDINGS: The cardiomediastinal silhouette is within normal limits. Aortic atherosclerosis is noted. The lungs remain hyperinflated. No airspace consolidation, edema, pleural effusion, or pneumothorax is identified. Surgical clips are noted in the lower neck. No acute osseous abnormality is identified. IMPRESSION: No active cardiopulmonary disease. Electronically Signed   By: Logan Bores M.D.   On: 06/03/2017 13:12   Dg  Tibia/fibula Left  Result Date: 06/03/2017 CLINICAL DATA:  Pain following fall EXAM: LEFT TIBIA AND FIBULA - 2 VIEW COMPARISON:  None. FINDINGS: Frontal and lateral views were obtained. There is no fracture or dislocation. No abnormal periosteal reaction. Knee and ankle joints appear unremarkable. There are foci of calcification in the popliteal and trifurcation region arteries. IMPRESSION: No fracture or dislocation. No appreciable arthropathic change. Areas of arterial vascular calcifications/atherosclerosis noted. Electronically Signed   By: Lowella Grip III M.D.   On: 06/03/2017 13:12   Ct Head Wo Contrast  Result Date: 06/03/2017 CLINICAL DATA:  Posttraumatic headache after fall at home. No loss of consciousness. EXAM: CT HEAD WITHOUT CONTRAST CT CERVICAL SPINE WITHOUT CONTRAST TECHNIQUE: Multidetector CT imaging of the head and cervical spine was performed following the  standard protocol without intravenous contrast. Multiplanar CT image reconstructions of the cervical spine were also generated. COMPARISON:  CT scan of August 24, 2010. FINDINGS: CT HEAD FINDINGS Brain: Mild chronic ischemic white matter disease is noted. No mass effect or midline shift is noted. Ventricular size is within normal limits. There is no evidence of mass lesion, hemorrhage or acute infarction. Vascular: No hyperdense vessel or unexpected calcification. Skull: Normal. Negative for fracture or focal lesion. Sinuses/Orbits: No acute finding. Other: None. CT CERVICAL SPINE FINDINGS Alignment: Reversal of normal lordosis is noted which most likely is positional in origin. Skull base and vertebrae: No acute fracture. No primary bone lesion or focal pathologic process. Soft tissues and spinal canal: No prevertebral fluid or swelling. No visible canal hematoma. Disc levels: Mild degenerative disc disease is noted at C4-5, C5-6 and C6-7 with osteophyte formation. Upper chest: Negative. Other: None. IMPRESSION: Mild chronic ischemic  white matter disease. No acute intracranial abnormality seen. Mild multilevel degenerative disc disease. No acute abnormality seen in the cervical spine. Electronically Signed   By: Marijo Conception, M.D.   On: 06/03/2017 13:11   Ct Cervical Spine Wo Contrast  Result Date: 06/03/2017 CLINICAL DATA:  Posttraumatic headache after fall at home. No loss of consciousness. EXAM: CT HEAD WITHOUT CONTRAST CT CERVICAL SPINE WITHOUT CONTRAST TECHNIQUE: Multidetector CT imaging of the head and cervical spine was performed following the standard protocol without intravenous contrast. Multiplanar CT image reconstructions of the cervical spine were also generated. COMPARISON:  CT scan of August 24, 2010. FINDINGS: CT HEAD FINDINGS Brain: Mild chronic ischemic white matter disease is noted. No mass effect or midline shift is noted. Ventricular size is within normal limits. There is no evidence of mass lesion, hemorrhage or acute infarction. Vascular: No hyperdense vessel or unexpected calcification. Skull: Normal. Negative for fracture or focal lesion. Sinuses/Orbits: No acute finding. Other: None. CT CERVICAL SPINE FINDINGS Alignment: Reversal of normal lordosis is noted which most likely is positional in origin. Skull base and vertebrae: No acute fracture. No primary bone lesion or focal pathologic process. Soft tissues and spinal canal: No prevertebral fluid or swelling. No visible canal hematoma. Disc levels: Mild degenerative disc disease is noted at C4-5, C5-6 and C6-7 with osteophyte formation. Upper chest: Negative. Other: None. IMPRESSION: Mild chronic ischemic white matter disease. No acute intracranial abnormality seen. Mild multilevel degenerative disc disease. No acute abnormality seen in the cervical spine. Electronically Signed   By: Marijo Conception, M.D.   On: 06/03/2017 13:11    Procedures .Marland KitchenLaceration Repair Date/Time: 06/03/2017 4:18 PM Performed by: Fransico Meadow, PA-C Authorized by: Fransico Meadow,  PA-C   Consent:    Consent obtained:  Verbal   Consent given by:  Patient   Risks discussed:  Infection   Alternatives discussed:  No treatment Anesthesia (see MAR for exact dosages):    Anesthesia method:  None Laceration details:    Location:  Leg   Leg location:  L upper leg   Length (cm):  22   Depth (mm):  1.5 Repair type:    Repair type:  Simple Pre-procedure details:    Preparation:  Patient was prepped and draped in usual sterile fashion Exploration:    Wound exploration: wound explored through full range of motion     Contaminated: no   Treatment:    Area cleansed with:  Betadine   Amount of cleaning:  Standard   Irrigation solution:  Sterile saline   Irrigation method:  Syringe  Visualized foreign bodies/material removed: no   Skin repair:    Repair method:  Sutures   Suture size:  4-0   Suture technique:  Simple interrupted   Number of sutures:  22 Approximation:    Approximation:  Close Post-procedure details:    Patient tolerance of procedure:  Tolerated well, no immediate complications Comments:     4 subq sutures.   Pt has very thin skin,  steristrips placed on skin edges to help keep skin from tearing.     (including critical care time)  Medications Ordered in ED Medications  fentaNYL (SUBLIMAZE) injection 100 mcg (100 mcg Intravenous Given 06/03/17 1222)  ondansetron (ZOFRAN) injection 4 mg (4 mg Intravenous Given 06/03/17 1222)  lidocaine (PF) (XYLOCAINE) 1 % injection 30 mL (30 mLs Infiltration Given 06/03/17 1457)     Initial Impression / Assessment and Plan / ED Course  I have reviewed the triage vital signs and the nursing notes.  Pertinent labs & imaging results that were available during my care of the patient were reviewed by me and considered in my medical decision making (see chart for details).     Pt noted to have decreased 02 sat.  Ct chest ordered.   Final Clinical Impressions(s) / ED Diagnoses   Final diagnoses:  Laceration of  left lower extremity, initial encounter  Fall, initial encounter  Contusion of head, unspecified part of head, initial encounter    ED Discharge Orders    None    Pt's care turned over to Dr. [name] An After Visit Summary was printed and given to the patient.    Fransico Meadow, [place] 06/04/17 2025    Gareth Morgan, MD 06/04/17 [phone]

## 2017-08-15 ENCOUNTER — Emergency Department (HOSPITAL_BASED_OUTPATIENT_CLINIC_OR_DEPARTMENT_OTHER): Payer: Medicare Other

## 2017-08-15 ENCOUNTER — Other Ambulatory Visit: Payer: Self-pay

## 2017-08-15 ENCOUNTER — Encounter (HOSPITAL_BASED_OUTPATIENT_CLINIC_OR_DEPARTMENT_OTHER): Payer: Self-pay | Admitting: *Deleted

## 2017-08-15 ENCOUNTER — Emergency Department (HOSPITAL_BASED_OUTPATIENT_CLINIC_OR_DEPARTMENT_OTHER)
Admission: EM | Admit: 2017-08-15 | Discharge: 2017-08-15 | Disposition: A | Discharge status: Home / Self Care | Payer: Medicare Other | Attending: Emergency Medicine | Admitting: Emergency Medicine

## 2017-08-15 DIAGNOSIS — I10 Essential (primary) hypertension: Secondary | ICD-10-CM | POA: Insufficient documentation

## 2017-08-15 DIAGNOSIS — Z79899 Other long term (current) drug therapy: Secondary | ICD-10-CM | POA: Insufficient documentation

## 2017-08-15 DIAGNOSIS — S0990XA Unspecified injury of head, initial encounter: Secondary | ICD-10-CM

## 2017-08-15 DIAGNOSIS — Z23 Encounter for immunization: Secondary | ICD-10-CM | POA: Insufficient documentation

## 2017-08-15 DIAGNOSIS — Y999 Unspecified external cause status: Secondary | ICD-10-CM | POA: Diagnosis not present

## 2017-08-15 DIAGNOSIS — Y939 Activity, unspecified: Secondary | ICD-10-CM | POA: Insufficient documentation

## 2017-08-15 DIAGNOSIS — W2209XA Striking against other stationary object, initial encounter: Secondary | ICD-10-CM | POA: Insufficient documentation

## 2017-08-15 DIAGNOSIS — Z87891 Personal history of nicotine dependence: Secondary | ICD-10-CM | POA: Diagnosis not present

## 2017-08-15 DIAGNOSIS — S0101XA Laceration without foreign body of scalp, initial encounter: Secondary | ICD-10-CM | POA: Insufficient documentation

## 2017-08-15 DIAGNOSIS — Y92009 Unspecified place in unspecified non-institutional (private) residence as the place of occurrence of the external cause: Secondary | ICD-10-CM | POA: Insufficient documentation

## 2017-08-15 MED ORDER — LIDOCAINE-EPINEPHRINE (PF) 2 %-1:200000 IJ SOLN
10.0000 mL | Freq: Once | INTRAMUSCULAR | Status: DC
  Filled 2017-08-15 (×2): qty 10

## 2017-08-15 MED ORDER — TETANUS-DIPHTH-ACELL PERTUSSIS 5-2.5-18.5 LF-MCG/0.5 IM SUSP
0.5000 mL | Freq: Once | INTRAMUSCULAR | Status: AC
  Administered 2017-08-15: 0.5 mL via INTRAMUSCULAR
  Filled 2017-08-15: qty 0.5

## 2017-08-15 NOTE — ED Provider Notes (Signed)
[name] Provider Note   CSN: [id number] Arrival date & time: 08/15/17  1734     History   Chief Complaint Chief Complaint  Patient presents with  . Fall  . Laceration    HPI [name] is a 77 y.o. female.  [name] is a 77 y.o. Female with a history of vertigo, breast cancer, hypertension, GERD and depression, who presents to the emergency [facility] for evaluation of head injury and scalp laceration after a fall.  Patient reports she had 1 of her typical dizzy episodes and fell down, she did not hit her head upon falling but when she went to stand up she hit her head on the corner of a dresser sustaining a 1 inch laceration to the left side of her scalp, bleeding controlled, no blood thinners.  Patient denies loss of consciousness, since the incident she is had no vomiting, nausea, vision changes, numbness weakness or tingling.  Patient reports history of vertigo which is being followed by her primary care doctor she has been referred to an ENT and has an appointment on July 11.  Patient reports no acute worsening or change in her vertigo symptoms.  No headache.  There is from the fall, no neck or back pain no chest pain, or abdominal pain, no pain in the extremities.     Past Medical History:  Diagnosis Date  . Anxiety   . Arthritis   . Breast cancer ([place]) 2005   left  . colon cancer 1987  . Depression    no problems at this time  . GERD (gastroesophageal reflux disease)    no problems at this time.  . Hypertension   . Osteoporosis   . Skin cancer 1987   Left leg    Patient Active Problem List   Diagnosis Date Noted  . Obese 08/22/2012  . 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 BREAST SURGERY     left extremity restriction  . CHOLECYSTECTOMY  70's  . COLON SURGERY    . HAND SURGERY Bilateral   . JOINT REPLACEMENT Right 2011   hip  . MASTECTOMY    . MULTIPLE  TOOTH EXTRACTIONS    . SKIN CANCER EXCISION    . THYROID SURGERY    . TOTAL HIP REVISION Right 08/21/2012   Procedure: RIGHT TOTAL HIP REVISION LINER AND HEAD BALL;  Surgeon: Mauri Pole, MD;  Location: [facility];  Service: Orthopedics;  Laterality: Right;     OB History   None      Home Medications    Prior to Admission medications   Medication Sig Start Date End Date Taking? Authorizing Provider  acetaminophen (TYLENOL) 500 MG tablet Take 1,000 mg by mouth as needed for mild pain.    [provider]  cetirizine (ZYRTEC) 10 MG tablet Take 10 mg by mouth as needed for allergies.    [provider]  clonazePAM (KLONOPIN) 1 MG tablet Take 1 mg by mouth at bedtime. Anxiety    [provider]  DULoxetine (CYMBALTA) 60 MG capsule Take 60 mg by mouth every evening.    [provider]  HYDROcodone-acetaminophen (NORCO/VICODIN) 5-325 MG tablet Take 1 tablet by mouth every 4 (four) hours as needed. 06/03/17   [name], MD  levothyroxine (SYNTHROID, LEVOTHROID) 88 MCG tablet Take 88 mcg by mouth daily. 03/06/17   [provider]  Multiple Vitamin (MULTIVITAMIN WITH MINERALS) TABS  Take 1 tablet by mouth daily.    [provider]  naphazoline-glycerin (CLEAR EYES REDNESS) 0.012-0.2 % SOLN Place 1 drop into both eyes as needed for eye irritation.    [provider]  pravastatin (PRAVACHOL) 20 MG tablet Take 20 mg by mouth daily. 11/20/16   [provider]  triamterene-hydrochlorothiazide (MAXZIDE-25) 37.5-25 MG per tablet Take 1 tablet by mouth every morning.     [provider]    Family History No family history on file.  Social History Social History   Tobacco Use  . Smoking status: Former Smoker    Packs/day: 1.00    Years: 50.00    Pack years: 50.00    Types: Cigarettes    Last attempt to quit: 08/23/2004    Years since quitting: 12.9  . Smokeless tobacco: Never Used  Substance Use Topics  . Alcohol use:  No  . Drug use: No     Allergies   Fentanyl; Percocet [oxycodone-acetaminophen]; and Aspirin   Review of Systems Review of Systems  Constitutional: Negative for chills and fever.  HENT: Positive for facial swelling.   Eyes: Negative for visual disturbance.  Respiratory: Negative for shortness of breath.   Cardiovascular: Negative for chest pain.  Gastrointestinal: Negative for abdominal pain, diarrhea and vomiting.  Musculoskeletal: Negative for arthralgias, back pain, myalgias and neck pain.  Skin: Positive for wound.  Neurological: Positive for dizziness. Negative for tremors, seizures, syncope, facial asymmetry, speech difficulty, weakness, light-headedness, numbness and headaches.     Physical Exam Updated Vital Signs BP 137/77   Pulse 86   Temp (!) 97.5 F (36.4 C) (Oral)   Resp 20   Ht 5' 7.5" (1.715 m)   Wt 80.3 kg (177 lb)   SpO2 92%   BMI 27.31 kg/m   Physical Exam  Constitutional: She is oriented to person, place, and time. She appears well-developed and well-nourished. No distress.  HENT:  Head: Normocephalic and atraumatic.  2.5 cm laceration to the left side of the scalp, bleeding controlled, mild surrounding hematoma but no appreciable step-off or deformity, no CSF otorrhea or hemotympanum.  Eyes: Pupils are equal, round, and reactive to light. EOM are normal. Right eye exhibits no discharge. Left eye exhibits no discharge.  No nystagmus  Neck: Normal range of motion. Neck supple.  No midline C-spine tenderness  Cardiovascular: Normal rate, regular rhythm, normal heart sounds and intact distal pulses.  Pulmonary/Chest: Effort normal and breath sounds normal. No respiratory distress.  Respirations equal and unlabored, patient able to speak in full sentences, lungs clear to auscultation bilaterally  Musculoskeletal: She exhibits no edema or deformity.  T-spine and L-spine nontender to palpation at midline. Patient moves all extremities without  difficulty. All joints supple and easily movable, no erythema, swelling or palpable deformity, all compartments soft.  Neurological: She is alert and oriented to person, place, and time. Coordination normal.  Speech is clear, able to follow commands CN III-XII intact Normal strength in upper and lower extremities bilaterally including dorsiflexion and plantar flexion, strong and equal grip strength Sensation normal to light and sharp touch Moves extremities without ataxia, coordination intact Normal finger to nose and rapid alternating movements No pronator drift  Skin: Skin is warm and dry. She is not diaphoretic.  Psychiatric: She has a normal mood and affect. Her behavior is normal.  Nursing note and vitals reviewed.    ED Treatments / Results  Labs (all labs ordered are listed, but only abnormal results are displayed) Labs Reviewed -  No data to display  EKG None  Radiology Ct Head Wo Contrast  Result Date: 08/15/2017 CLINICAL DATA:  Dizziness. Fell and hit head. No loss of consciousness. EXAM: CT HEAD WITHOUT CONTRAST TECHNIQUE: Contiguous axial images were obtained from the base of the skull through the vertex without intravenous contrast. COMPARISON:  None. FINDINGS: Brain: No evidence for acute infarction, hemorrhage, mass lesion, hydrocephalus, or extra-axial fluid. Normal for age cerebral volume. Mild hypoattenuation of white matter, likely small vessel disease. Vascular: Calcification of the cavernous internal carotid arteries consistent with cerebrovascular atherosclerotic disease. No signs of intracranial large vessel occlusion. Skull: Calvarium intact. Sinuses/Orbits: No layering sinus fluid. Negative orbits. BILATERAL cataract extraction. Other: No mastoid fluid. LEFT posterior parietal scalp soft tissue swelling. IMPRESSION: Normal for age cerebral volume with mild small vessel disease. Cerebrovascular atherosclerotic disease. No skull fracture or intracranial hemorrhage.   LEFT scalp hematoma. Electronically Signed   By: Staci Righter M.D.   On: 08/15/2017 19:13    Procedures .Marland KitchenLaceration Repair Date/Time: 08/15/2017 7:50 PM Performed by: Jacqlyn Larsen, PA-C Authorized by: Jacqlyn Larsen, PA-C   Consent:    Consent obtained:  Verbal   Consent given by:  Patient   Risks discussed:  Infection, pain and poor wound healing   Alternatives discussed:  No treatment Anesthesia (see MAR for exact dosages):    Anesthesia method:  Local infiltration   Local anesthetic:  Lidocaine 2% WITH epi Laceration details:    Location:  Scalp   Scalp location:  L parietal   Length (cm):  2.5   Depth (mm):  5 Repair type:    Repair type:  Simple Pre-procedure details:    Preparation:  Patient was prepped and draped in usual sterile fashion and imaging obtained to evaluate for foreign bodies Exploration:    Hemostasis achieved with:  Direct pressure and epinephrine   Wound exploration: entire depth of wound probed and visualized     Wound extent: areolar tissue violated     Wound extent: no underlying fracture noted   Treatment:    Area cleansed with:  Saline   Amount of cleaning:  Standard   Irrigation solution:  Sterile saline   Irrigation method:  Syringe Skin repair:    Repair method:  Staples   Number of staples:  3 Approximation:    Approximation:  Close Post-procedure details:    Dressing:  Open (no dressing)   Patient tolerance of procedure:  Tolerated well, no immediate complications   (including critical care time)  Medications Ordered in ED Medications  Tdap (BOOSTRIX) injection 0.5 mL (0.5 mLs Intramuscular Given 08/15/17 1915)     Initial Impression / Assessment and Plan / ED Course  I have reviewed the triage vital signs and the nursing notes.  Pertinent labs & imaging results that were available during my care of the patient were reviewed by me and considered in my medical decision making (see chart for details).  Patient presents for  evaluation of head injury and 2.5 cm scalp laceration, no blood thinners, bleeding controlled, tetanus updated.  Patient with history of vertigo that is currently being followed by her PCP, recent referral to ENT, no acute change in the symptoms she did not feel to work this up further, patient with head injury, no LOC, no amnesia or confusion, no vomiting, vision changes, numbness weakness or tingling.  Normal neurologic exam.  CT head shows left scalp hematoma surrounding laceration, but no evidence of intracranial injury.  Scalp laceration anesthetized with local  infiltration and repaired with staples, patient tolerated the procedure well.  Discussed appropriate wound care and return precautions.  Patient and family expressed understanding and are in agreement with plan.  Patient discussed with Dr. Wilson Singer, who saw patient as well and agrees with plan.   Final Clinical Impressions(s) / ED Diagnoses   Final diagnoses:  Injury of head, initial encounter  Laceration of scalp, initial encounter    ED Discharge Orders    None       Janet Berlin 08/16/17 0043    Virgel Manifold, MD 08/22/17 [phone]

## 2017-08-15 NOTE — ED Notes (Signed)
RN Nonie Hoyer irrigated wound

## 2017-08-15 NOTE — ED Notes (Signed)
Patient transported to CT 

## 2017-08-15 NOTE — ED Notes (Addendum)
Pt denies getting dizzy this episode. Pt states she was bending over to pick something up and just lost her balance. Pt has no amnesia to events. Pt denies LOC.

## 2017-08-15 NOTE — Discharge Instructions (Signed)
You were examined today for a head injury and possible concussion. Your head CT showed no evidence of  Injury today.  The laceration on her scalp was repaired with staples, these will need to be removed in 7 to 10 days, you may shower and wash the area with gentle shampoo, do not submerge the head underwater, monitor the area for signs of infection such as erythema, swelling, increasing pain or drainage.   Sometimes serious problems can develop after a head injury. Please return to the emergency department if you experience any of the following symptoms: Repeated vomiting Headache that gets worse and does not go away Loss of consciousness or inability to stay awake at times when you normally would be able to Getting more confused, restless or agitated Convulsions or seizures Difficulty walking or feeling off balance Weakness or numbness Vision changes

## 2017-08-15 NOTE — ED Triage Notes (Signed)
She go dizzy and fell. (she has an appointment with an ENT for vertigo). When she stood she hit her head on the corner of the dresser. Small 1" laceration to her scalp. No LOC.

## 2018-09-18 ENCOUNTER — Encounter (HOSPITAL_COMMUNITY): Payer: Self-pay | Admitting: Obstetrics and Gynecology

## 2018-09-18 ENCOUNTER — Emergency Department (HOSPITAL_COMMUNITY): Payer: Medicare Other

## 2018-09-18 ENCOUNTER — Emergency Department (HOSPITAL_COMMUNITY)
Admission: EM | Admit: 2018-09-18 | Discharge: 2018-09-18 | Disposition: A | Discharge status: Home / Self Care | Payer: Medicare Other | Attending: Emergency Medicine | Admitting: Emergency Medicine

## 2018-09-18 ENCOUNTER — Other Ambulatory Visit: Payer: Self-pay

## 2018-09-18 DIAGNOSIS — Y999 Unspecified external cause status: Secondary | ICD-10-CM | POA: Diagnosis not present

## 2018-09-18 DIAGNOSIS — Y9301 Activity, walking, marching and hiking: Secondary | ICD-10-CM | POA: Insufficient documentation

## 2018-09-18 DIAGNOSIS — S8991XA Unspecified injury of right lower leg, initial encounter: Secondary | ICD-10-CM | POA: Diagnosis present

## 2018-09-18 DIAGNOSIS — I10 Essential (primary) hypertension: Secondary | ICD-10-CM | POA: Insufficient documentation

## 2018-09-18 DIAGNOSIS — S8002XA Contusion of left knee, initial encounter: Secondary | ICD-10-CM | POA: Diagnosis not present

## 2018-09-18 DIAGNOSIS — E039 Hypothyroidism, unspecified: Secondary | ICD-10-CM | POA: Insufficient documentation

## 2018-09-18 DIAGNOSIS — Y92019 Unspecified place in single-family (private) house as the place of occurrence of the external cause: Secondary | ICD-10-CM | POA: Insufficient documentation

## 2018-09-18 DIAGNOSIS — Z96641 Presence of right artificial hip joint: Secondary | ICD-10-CM | POA: Insufficient documentation

## 2018-09-18 DIAGNOSIS — M25561 Pain in right knee: Secondary | ICD-10-CM

## 2018-09-18 DIAGNOSIS — S8001XA Contusion of right knee, initial encounter: Secondary | ICD-10-CM | POA: Diagnosis not present

## 2018-09-18 DIAGNOSIS — Z87891 Personal history of nicotine dependence: Secondary | ICD-10-CM | POA: Insufficient documentation

## 2018-09-18 DIAGNOSIS — Z79899 Other long term (current) drug therapy: Secondary | ICD-10-CM | POA: Insufficient documentation

## 2018-09-18 DIAGNOSIS — R51 Headache: Secondary | ICD-10-CM | POA: Diagnosis not present

## 2018-09-18 DIAGNOSIS — W010XXA Fall on same level from slipping, tripping and stumbling without subsequent striking against object, initial encounter: Secondary | ICD-10-CM | POA: Diagnosis not present

## 2018-09-18 DIAGNOSIS — W19XXXA Unspecified fall, initial encounter: Secondary | ICD-10-CM

## 2018-09-18 DIAGNOSIS — M25562 Pain in left knee: Secondary | ICD-10-CM

## 2018-09-18 MED ORDER — HYDROCODONE-ACETAMINOPHEN 10-325 MG PO TABS
1.0000 | ORAL_TABLET | Freq: Once | ORAL | Status: AC
  Administered 2018-09-18: 1 via ORAL
  Filled 2018-09-18: qty 1

## 2018-09-18 NOTE — Discharge Instructions (Signed)
Your imaging today showed no evidence of acute fracture or dislocations from your mechanical fall.  The soft tissue was swollen on your knees and is likely indicative of contusions.  The CT of your head and neck did not show acute fracture or dislocations.  NO hand fracture was seen. Please follow-up with your PCP and if your symptoms do not improve, follow-up with orthopedic surgeon.  If any symptoms change or worsen, please return to the nearest emergency department.

## 2018-09-18 NOTE — ED Notes (Signed)
MD at bedside. 

## 2018-09-18 NOTE — ED Notes (Signed)
Fall risk and restricted extremity bands in place. Pt has a left restricted extremity. Pt's daughter is present and allowed to come back to room per MD.

## 2018-09-18 NOTE — ED Triage Notes (Signed)
Per EMS: PT is coming from home with a c/o fall. Pt reportedly fell and hit her left knee. Pt also reportedly has some carpet burn to the facial area.  Pt is not on blood thinners and denies LOC. Pt reportedly has no hx of dementia. Pt lives with family at home.

## 2018-09-18 NOTE — ED Notes (Signed)
When RN assisted pt to bathroom, pt reported pain in the left hand. Bruising noted to the hand, but no difficulty moving the hand. Pt states "I think it's broken"  MD made aware of reported pain

## 2018-09-18 NOTE — ED Provider Notes (Signed)
[facility] DEPT Provider Note   CSN: [id number] Arrival date & time: 09/18/18  0730     History   Chief Complaint Chief Complaint  Patient presents with   Fall   Knee Pain    HPI CHENE KASINGER is a 78 y.o. female.     The history is provided by the patient and medical records. No language interpreter was used.  Fall This is a new problem. The current episode started less than 1 hour ago. The problem occurs constantly. The problem has not changed since onset.Associated symptoms include headaches. Pertinent negatives include no chest pain, no abdominal pain and no shortness of breath. Nothing aggravates the symptoms. Nothing relieves the symptoms. She has tried nothing for the symptoms.  Knee Pain Location:  Knee Injury: yes   Mechanism of injury: fall   Knee location:  R knee and L knee Chronicity:  Recurrent Foreign body present:  No foreign bodies Relieved by:  Nothing Worsened by:  Flexion Ineffective treatments:  None tried Associated symptoms: neck pain   Associated symptoms: no back pain, no fatigue and no fever     Past Medical History:  Diagnosis Date   Anxiety    Arthritis    Breast cancer ([place]) 2005   left   colon cancer 1987   Depression    no problems at this time   GERD (gastroesophageal reflux disease)    no problems at this time.   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      Home Medications    Prior to Admission medications   Medication Sig Start Date End Date Taking? Authorizing Provider  acetaminophen (TYLENOL) 500 MG tablet Take 1,000 mg by mouth as needed for mild pain.    [provider]  cetirizine (ZYRTEC) 10 MG tablet Take 10 mg by mouth as needed for allergies.    [provider]  clonazePAM (KLONOPIN) 1 MG tablet Take 1 mg by mouth at bedtime. Anxiety    [provider]  DULoxetine (CYMBALTA) 60 MG capsule Take 60 mg by mouth every evening.    [provider]  HYDROcodone-acetaminophen (NORCO/VICODIN) 5-325 MG tablet Take 1 tablet by mouth every 4 (four) hours as needed. 06/03/17   [name], MD  levothyroxine (SYNTHROID, LEVOTHROID) 88 MCG tablet Take 88 mcg by mouth daily. 03/06/17   [provider]  Multiple Vitamin (MULTIVITAMIN WITH MINERALS) TABS Take 1 tablet by mouth daily.    [provider]  naphazoline-glycerin (CLEAR EYES REDNESS) 0.012-0.2 % SOLN Place 1 drop into both eyes as needed for eye irritation.  [provider]  pravastatin (PRAVACHOL) 20 MG tablet Take 20 mg by mouth daily. 11/20/16   [provider]  triamterene-hydrochlorothiazide (MAXZIDE-25) 37.5-25 MG per tablet Take 1 tablet by mouth every morning.     [provider]    Family History No family history on file.  Social History Social History   Tobacco Use   Smoking status: Former Smoker    Packs/day: 1.00    Years: 50.00    Pack years: 50.00    Types: Cigarettes    Quit date: 08/23/2004    Years since quitting: 14.0   Smokeless tobacco: Never Used  Substance Use Topics   Alcohol use: No   Drug use: No     Allergies   Fentanyl, Percocet [oxycodone-acetaminophen], and Aspirin   Review of Systems Review of Systems    Constitutional: Negative for chills, diaphoresis, fatigue and fever.  HENT: Negative for congestion.   Eyes: Negative for redness and visual disturbance.  Respiratory: Negative for cough, chest tightness, shortness of breath and wheezing.   Cardiovascular: Negative for chest pain, palpitations and leg swelling.  Gastrointestinal: Negative for abdominal pain, constipation, diarrhea, nausea and vomiting.  Genitourinary: Negative for flank pain.  Musculoskeletal: Positive for neck pain. Negative for back pain and neck stiffness.  Neurological: Positive for headaches. Negative for dizziness, seizures, syncope, speech difficulty, weakness, light-headedness and numbness.  Psychiatric/Behavioral: Negative for agitation.  All other systems reviewed and are negative.    Physical Exam Updated Vital Signs BP (!) 176/103 (BP Location: Right Arm)    Pulse (!) 108    Temp 98.6 F (37 C) (Oral)    Resp (!) 33    SpO2 91%   Physical Exam Vitals signs and nursing note reviewed.  Constitutional:      General: She is not in acute distress.    Appearance: She is well-developed.  HENT:     Head: Normocephalic and atraumatic.     Nose: No congestion or rhinorrhea.     Mouth/Throat:     Pharynx: No oropharyngeal exudate or posterior oropharyngeal erythema.  Eyes:     Conjunctiva/sclera: Conjunctivae normal.     Pupils: Pupils are equal, round, and reactive to light.  Neck:     Musculoskeletal: Neck supple. Muscular tenderness present.      Comments: In C collar Cardiovascular:     Rate and Rhythm: Normal rate and regular rhythm.     Heart sounds: No murmur.  Pulmonary:     Effort: Pulmonary effort is normal. No respiratory distress.     Breath sounds: Normal breath sounds. No wheezing or rhonchi.  Chest:     Chest wall: No tenderness.  Abdominal:     General: Abdomen is flat.     Palpations: Abdomen is soft.     Tenderness: There is no abdominal tenderness. There is no right CVA tenderness  or left CVA tenderness.  Musculoskeletal:        General: Swelling and tenderness present.  Skin:    General: Skin is warm and dry.     Capillary Refill: Capillary refill takes less than 2 seconds.  Neurological:     General: No focal deficit present.     Mental Status: She is alert.     GCS: GCS eye subscore is 4. GCS verbal subscore is 5. GCS motor subscore is 6.     Cranial Nerves: No dysarthria or facial asymmetry.     Sensory: Sensation is intact. No sensory deficit.  Motor: Motor function is intact. No weakness, tremor, atrophy, abnormal muscle tone or seizure activity.     Coordination: Coordination is intact.  Psychiatric:        Mood and Affect: Mood normal.      ED Treatments / Results  Labs (all labs ordered are listed, but only abnormal results are displayed) Labs Reviewed - No data to display  EKG None  Radiology Ct Head Wo Contrast  Result Date: 09/18/2018 CLINICAL DATA:  Fall at home. EXAM: CT HEAD WITHOUT CONTRAST CT CERVICAL SPINE WITHOUT CONTRAST TECHNIQUE: Multidetector CT imaging of the head and cervical spine was performed following the standard protocol without intravenous contrast. Multiplanar CT image reconstructions of the cervical spine were also generated. COMPARISON:  Prior CT of the head on 08/15/2017 and CT of the cervical spine on 06/03/2017 FINDINGS: CT HEAD FINDINGS Brain: Stable small vessel ischemic disease in the periventricular white matter. No evidence of acute infarction, hemorrhage, hydrocephalus, extra-axial collection or mass lesion/mass effect. Vascular: No hyperdense vessel or unexpected calcification. Skull: Normal. Negative for fracture or focal lesion. Sinuses/Orbits: No acute finding. Other: None. CT CERVICAL SPINE FINDINGS Alignment: Normal.  No subluxation. Skull base and vertebrae: No fracture or bone lesion identified. Soft tissues and spinal canal: No evidence of soft tissue swelling or focal hematoma. No incidental masses or  lymphadenopathy. Status post thyroidectomy. Disc levels: Stable mild degenerative disc disease at C4-5 and C5-6. Upper chest: Negative. IMPRESSION: 1. No acute findings by head CT with stable chronic small vessel disease. 2. No acute findings by cervical spine CT with stable mild degenerative disc disease. Electronically Signed   By: Aletta Edouard M.D.   On: 09/18/2018 09:03   Ct Cervical Spine Wo Contrast  Result Date: 09/18/2018 CLINICAL DATA:  Fall at home. EXAM: CT HEAD WITHOUT CONTRAST CT CERVICAL SPINE WITHOUT CONTRAST TECHNIQUE: Multidetector CT imaging of the head and cervical spine was performed following the standard protocol without intravenous contrast. Multiplanar CT image reconstructions of the cervical spine were also generated. COMPARISON:  Prior CT of the head on 08/15/2017 and CT of the cervical spine on 06/03/2017 FINDINGS: CT HEAD FINDINGS Brain: Stable small vessel ischemic disease in the periventricular white matter. No evidence of acute infarction, hemorrhage, hydrocephalus, extra-axial collection or mass lesion/mass effect. Vascular: No hyperdense vessel or unexpected calcification. Skull: Normal. Negative for fracture or focal lesion. Sinuses/Orbits: No acute finding. Other: None. CT CERVICAL SPINE FINDINGS Alignment: Normal.  No subluxation. Skull base and vertebrae: No fracture or bone lesion identified. Soft tissues and spinal canal: No evidence of soft tissue swelling or focal hematoma. No incidental masses or lymphadenopathy. Status post thyroidectomy. Disc levels: Stable mild degenerative disc disease at C4-5 and C5-6. Upper chest: Negative. IMPRESSION: 1. No acute findings by head CT with stable chronic small vessel disease. 2. No acute findings by cervical spine CT with stable mild degenerative disc disease. Electronically Signed   By: Aletta Edouard M.D.   On: 09/18/2018 09:03   Dg Knee Complete 4 Views Left  Result Date: 09/18/2018 CLINICAL DATA:  Bilateral knee pain and  swelling and bruising since the patient fell this morning. EXAM: LEFT KNEE - COMPLETE 4+ VIEW COMPARISON:  Radiographs dated 06/03/2017 FINDINGS: No evidence of fracture, dislocation, or joint effusion. No evidence of arthropathy or other focal bone abnormality. Prominent soft tissue swelling anterior to the patella and distal quadriceps tendon. IMPRESSION: No acute bone abnormality. Soft tissue swelling anterior to the patella and distal quadriceps tendon. Electronically Signed  By: Lorriane Shire M.D.   On: 09/18/2018 08:31   Dg Knee Complete 4 Views Right  Result Date: 09/18/2018 CLINICAL DATA:  Bilateral knee pain and swelling secondary to a fall this morning. EXAM: RIGHT KNEE - COMPLETE 4+ VIEW COMPARISON:  None. FINDINGS: No evidence of fracture, dislocation, or joint effusion. No evidence of arthropathy or other focal bone abnormality. There is slight soft tissue swelling anterior to the patellar tendon. IMPRESSION: No acute bone abnormality. Soft tissue swelling anterior to the patellar tendon. Electronically Signed   By: Lorriane Shire M.D.   On: 09/18/2018 08:32   Dg Hand Complete Left  Result Date: 09/18/2018 CLINICAL DATA:  Left hand pain after fall EXAM: LEFT HAND - COMPLETE 3+ VIEW COMPARISON:  None. FINDINGS: No acute fracture identified. No malalignment. Partially evaluated the distal radial fixation hardware, grossly intact. Mild degenerative changes throughout the hand and carpus. Bones are demineralized. No focal soft tissue abnormality. IMPRESSION: No acute osseous abnormality identified, left hand. Electronically Signed   By: Davina Poke M.D.   On: 09/18/2018 11:01    Procedures Procedures (including critical care time)  Medications Ordered in ED Medications  HYDROcodone-acetaminophen (NORCO) 10-325 MG per tablet 1 tablet (has no administration in time range)     Initial Impression / Assessment and Plan / ED Course  I have reviewed the triage vital signs and the nursing  notes.  Pertinent labs & imaging results that were available during my care of the patient were reviewed by me and considered in my medical decision making (see chart for details).        *BERNICE MCAULIFFE is a 78 y.o. female with a past medical history significant for hypertension, GERD, hypothyroidism, and prior COPD per chart who presents with a fall.  Patient reports that she had a mechanical fall today while going back from her door where she thought she heard someone knocking.  She reports she got tripped up and fell.  She denied loss of consciousness but did fell backwards hitting her head and neck.  She reports pain in her occiput and her upper neck.  She denies vision changes, nausea, vomiting.  She reports the pain is moderate to severe.  She also reports pain in both of her knees left worse than right.  She denies pain in her hips, pelvis, abdomen, or torso.  On exam, patient has bruising and swelling of her left knee.  Also some mild bruising of her right knee.  Tenderness present on both knees.  Patient has palpable DP pulses bilaterally with normal sensation and foot strength.  No pain with hip epilation or hip palpation.  Pelvis stable.  Abdomen and chest nontender.  Normal bowel sounds, normal breath sounds.  Back nontender.  Patient in cervical immobilization collar on arrival.  Patient has some pain on a [place] abrasion on the right hand.  Hemostatic.  Shared decision making conversation held with patient and daughter who arrived.  We will focus on a head and neck CT due to the pain and fall as well as the knee x-rays.  Given her mechanical nature of the fall and no other preceding symptoms, we agreed to minimize further work-up at this time.  Patient and family agreed with plan of care and anticipate reassessment after work-up.  Patient did not want pain medication on initial evaluation.  Cervical immobilization collar was removed after CT neck with reassuring.  CT head shows no  acute injury.  Knee x-rays show soft tissue injury  but no evidence of acute fracture dislocations.  Patient was complaining more of left hyperthenar pain than when she arrived and there is more swelling and bruising.  Due to this, x-ray obtained.  She reports that she is up-to-date with her tetanus.  Patient will have x-ray of her hand, anticipate discharge after.  11:38 AM Hand x-ray shows no fracture.  All of patient's imaging was reassuring and we suspect soft tissue injury with the swelling on the knees.  Patient was offered knee immobilizers or sleeves but she did not want them.  She wanted the dose of her normal pain medication of 10/325 Norco.  This was ordered.  Patient and family agree with discharge home with outpatient PCP and outpatient orthopedics follow-up.  Patient had no other questions or concerns and was discharged in good condition.    Final Clinical Impressions(s) / ED Diagnoses   Final diagnoses:  Acute pain of both knees  Fall, initial encounter  Contusion of left knee, initial encounter  Contusion of right knee, initial encounter    ED Discharge Orders    None      Clinical Impression: 1. Acute pain of both knees   2. Fall, initial encounter   3. Contusion of left knee, initial encounter   4. Contusion of right knee, initial encounter     Disposition: Discharge  Condition: Good  I have discussed the results, Dx and Tx plan with the pt(& family if present). He/she/they expressed understanding and agree(s) with the plan. Discharge instructions discussed at great length. Strict return precautions discussed and pt &/or family have verbalized understanding of the instructions. No further questions at time of discharge.    New Prescriptions   No medications on file    Follow Up: [facility] [place] [id number] [place] [phone]    Your PCP and your Orthopedic surgeon if  symptoms dont imprve.        Crystal Scarberry, Gwenyth Allegra, MD 09/18/18 1140

## 2018-09-18 NOTE — ED Notes (Signed)
Patient assisted to bathroom utilizing steady. Pt slightly unsteady and this RN and NT assisted her.

## 2020-01-04 ENCOUNTER — Ambulatory Visit
Admission: EM | Admit: 2020-01-04 | Discharge: 2020-01-04 | Disposition: A | Discharge status: Home / Self Care | Payer: Medicare Other | Attending: Physician Assistant | Admitting: Physician Assistant

## 2020-01-04 DIAGNOSIS — N3090 Cystitis, unspecified without hematuria: Secondary | ICD-10-CM

## 2020-01-04 LAB — POCT URINALYSIS DIP (MANUAL ENTRY)
Bilirubin, UA: NEGATIVE
Blood, UA: NEGATIVE
Glucose, UA: NEGATIVE mg/dL
Ketones, POC UA: NEGATIVE mg/dL
Nitrite, UA: NEGATIVE
Protein Ur, POC: NEGATIVE mg/dL
Spec Grav, UA: 1.025 (ref 1.010–1.025)
Urobilinogen, UA: 0.2 E.U./dL
pH, UA: 5.5 (ref 5.0–8.0)

## 2020-01-04 MED ORDER — CEPHALEXIN 500 MG PO CAPS: 500.0000 mg | ORAL_CAPSULE | Freq: Three times a day (TID) | ORAL | 0 refills | Status: AC

## 2020-01-04 NOTE — ED Triage Notes (Signed)
Per daughter pt has had lower back pain, confusion, and burning on urination with urgency and frequency for over a week. States hx of UTI

## 2020-01-04 NOTE — ED Provider Notes (Addendum)
[facility]    CSN: [id number] Arrival date & time: 01/04/20  1646      History   Chief Complaint Chief Complaint  Patient presents with  . Urinary Tract Infection    HPI [name] is a 79 y.o. female.   79 year old female comes in with daughter for a 1 week history of urinary symptoms.  Has had dysuria, urinary urgency, urinary frequency.  Has also complained of low back pain.  Denies fever, chills.  Denies vaginal discharge, itching.  Denies abdominal pain, nausea, vomiting.  Daughter states patient throughout this past week has had times where she did not recognize her.  Otherwise, patient without one-sided weakness, still ambulating on own without difficulty.     Past Medical History:  Diagnosis Date  . Anxiety   . Arthritis   . Breast cancer ([place]) 2005   left  . colon cancer 1987  . Depression    no problems at this time  . GERD (gastroesophageal reflux disease)    no problems at this time.  . Hypertension   . Osteoporosis   . Skin cancer 1987   Left leg    Patient Active Problem List   Diagnosis Date Noted  . Obese 08/22/2012  . 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 BREAST SURGERY     left extremity restriction  . CHOLECYSTECTOMY  70's  . COLON SURGERY    . HAND SURGERY Bilateral   . JOINT REPLACEMENT Right 2011   hip  . MASTECTOMY    . MULTIPLE TOOTH EXTRACTIONS    . SKIN CANCER EXCISION    . THYROID SURGERY    . TOTAL HIP REVISION Right 08/21/2012   Procedure: RIGHT TOTAL HIP REVISION LINER AND HEAD BALL;  Surgeon: Mauri Pole, MD;  Location: [facility];  Service: Orthopedics;  Laterality: Right;    OB History   No obstetric history on file.      Home Medications    Prior to Admission medications   Medication Sig Start Date End Date Taking? Authorizing Provider  acetaminophen (TYLENOL) 500 MG tablet Take 1,000 mg by mouth as needed for mild pain.    [provider]  albuterol (VENTOLIN HFA) 108 (90 Base) MCG/ACT inhaler Inhale 2 puffs into the lungs every 6 (six) hours as needed for wheezing or shortness of breath.  03/31/18   [provider]  cephALEXin (KEFLEX) 500 MG capsule Take 1 capsule (500 mg total) by mouth 3 (three) times daily. 01/04/20   Tasia Catchings, Jameson Tormey V, PA-C  clonazePAM (KLONOPIN) 1 MG tablet Take 1 mg by mouth at bedtime. Anxiety    [provider]  DULoxetine (CYMBALTA) 60 MG capsule Take 60 mg by mouth every evening.    [provider]  fexofenadine (ALLEGRA) 180 MG tablet Take 180 mg by mouth daily.    [provider]  hydrochlorothiazide (HYDRODIURIL) 25 MG tablet Take 25 mg by mouth every evening.    [provider]  levothyroxine (SYNTHROID, LEVOTHROID) 88 MCG tablet Take 88 mcg by mouth daily. 03/06/17   [provider]  Multiple Vitamin (MULTIVITAMIN WITH MINERALS) TABS Take 1 tablet by mouth daily.    [provider]  pravastatin (PRAVACHOL) 20 MG tablet Take 20 mg by mouth at bedtime.  11/20/16   [provider]    Family History History reviewed. No pertinent family history.  Social History Social History   Tobacco Use  .  Smoking status: Former Smoker    Packs/day: 1.00    Years: 50.00    Pack years: 50.00    Types: Cigarettes    Quit date: 08/23/2004    Years since quitting: 15.3  . Smokeless tobacco: Never Used  Substance Use Topics  . Alcohol use: No  . Drug use: No     Allergies   Ciprofloxacin hcl, Fentanyl, Percocet [oxycodone-acetaminophen], and Aspirin   Review of Systems Review of Systems  Reason unable to perform ROS: See HPI as above.     Physical Exam Triage Vital Signs ED Triage Vitals [01/04/20 1712]  Enc Vitals Group     BP (!) 142/87     Pulse Rate (!) 101     Resp 20     Temp (!) 97.5 F (36.4 C)     Temp Source Oral     SpO2 92 %     Weight      Height      Head Circumference      Peak Flow      Pain  Score      Pain Loc      Pain Edu?      Excl. in [place]?    No data found.  Updated Vital Signs BP (!) 142/87 (BP Location: Left Arm)   Pulse (!) 101   Temp (!) 97.5 F (36.4 C) (Oral)   Resp 20   SpO2 92%   Physical Exam Constitutional:      General: She is not in acute distress.    Appearance: She is well-developed. She is not ill-appearing, toxic-appearing or diaphoretic.  HENT:     Head: Normocephalic and atraumatic.  Eyes:     Conjunctiva/sclera: Conjunctivae normal.     Pupils: Pupils are equal, round, and reactive to light.  Cardiovascular:     Rate and Rhythm: Normal rate and regular rhythm.  Pulmonary:     Effort: Pulmonary effort is normal. No respiratory distress.     Comments: LCTAB Abdominal:     General: Bowel sounds are normal.     Palpations: Abdomen is soft.     Tenderness: There is no abdominal tenderness. There is no right CVA tenderness, left CVA tenderness, guarding or rebound.  Musculoskeletal:     Cervical back: Normal range of motion and neck supple.     Comments: No tenderness to palpation  Skin:    General: Skin is warm and dry.  Neurological:     Mental Status: She is alert and oriented to person, place, and time.     Comments: No facial drooping, slurred speech. Strength 5/5. Walking with walker at baseline  Psychiatric:        Behavior: Behavior normal.        Judgment: Judgment normal.      UC Treatments / Results  Labs (all labs ordered are listed, but only abnormal results are displayed) Labs Reviewed  POCT URINALYSIS DIP (MANUAL ENTRY) - Abnormal; Notable for the following components:      Result Value   Leukocytes, UA Small (1+) (*)    All other components within normal limits  URINE CULTURE    EKG   Radiology No results found.  Procedures Procedures (including critical care time)  Medications Ordered in UC Medications - No data to display  Initial Impression / Assessment and Plan / UC Course  I have reviewed the  triage vital signs and the nursing notes.  Pertinent labs & imaging results that were available during  my care of the patient were reviewed by me and considered in my medical decision making (see chart for details).    Patient is currently alert and oriented x 4.  Afebrile, nontoxic in appearance.  She is ambulating with assistance of walker, which is her baseline.  Negative CVA tenderness.  Abdomen soft, +BS, nontender to palpation.  Dipstick with small leukocytes.  Will treat for UTI with Keflex at this time.  Push fluids.  Low threshold for ED visit.  Return precautions given.  Otherwise to follow-up with PCP as scheduled next week for reevaluation.  Patient and daughter expresses understanding and agrees plan.  Final Clinical Impressions(s) / UC Diagnoses   Final diagnoses:  Cystitis    ED Prescriptions    Medication Sig Dispense Auth. Provider   cephALEXin (KEFLEX) 500 MG capsule Take 1 capsule (500 mg total) by mouth 3 (three) times daily. 21 capsule Ok Edwards, PA-C     PDMP not reviewed this encounter.   Ok Edwards, PA-C 01/04/20 1737    Ok Edwards, PA-C 01/04/20 1738

## 2020-01-04 NOTE — Discharge Instructions (Signed)
Your urine was positive for an urinary tract infection. Start keflex as directed. Keep hydrated, urine should be clear to pale yellow in color. Monitor for any worsening of symptoms, fever, worsening abdominal pain, nausea/vomiting, flank pain, go to the emergency department for further evaluation. If having worsening confusion, go to the emergency department for further evaluation. Otherwise, follow up with PCP as scheduled.

## 2020-01-08 LAB — URINE CULTURE

## 2021-11-15 DEATH — deceased
